# Patient Record
Sex: Female | Born: 1980 | Race: Black or African American | Hispanic: No | Marital: Single | Smoking: Former smoker
Health system: Southern US, Community
[De-identification: ages and names within clinical notes are randomized; demographics above are authoritative.]

## PROBLEM LIST (undated history)

## (undated) DIAGNOSIS — Z5189 Encounter for other specified aftercare: Secondary | ICD-10-CM

## (undated) DIAGNOSIS — O139 Gestational [pregnancy-induced] hypertension without significant proteinuria, unspecified trimester: Secondary | ICD-10-CM

## (undated) HISTORY — DX: Gestational (pregnancy-induced) hypertension without significant proteinuria, unspecified trimester: O13.9

## (undated) HISTORY — PX: INDUCED ABORTION: SHX677

---

## 2016-03-09 DIAGNOSIS — O1490 Unspecified pre-eclampsia, unspecified trimester: Secondary | ICD-10-CM

## 2020-07-29 DIAGNOSIS — Z20822 Contact with and (suspected) exposure to covid-19: Secondary | ICD-10-CM | POA: Diagnosis not present

## 2020-09-04 DIAGNOSIS — M549 Dorsalgia, unspecified: Secondary | ICD-10-CM | POA: Diagnosis not present

## 2020-09-04 DIAGNOSIS — N390 Urinary tract infection, site not specified: Secondary | ICD-10-CM | POA: Diagnosis not present

## 2020-09-17 DIAGNOSIS — R102 Pelvic and perineal pain: Secondary | ICD-10-CM | POA: Diagnosis not present

## 2020-09-17 DIAGNOSIS — Z01419 Encounter for gynecological examination (general) (routine) without abnormal findings: Secondary | ICD-10-CM | POA: Diagnosis not present

## 2020-09-19 ENCOUNTER — Other Ambulatory Visit: Payer: Self-pay | Admitting: Obstetrics and Gynecology

## 2020-09-19 DIAGNOSIS — Z1231 Encounter for screening mammogram for malignant neoplasm of breast: Secondary | ICD-10-CM

## 2020-09-25 ENCOUNTER — Ambulatory Visit: Payer: Self-pay

## 2020-09-27 ENCOUNTER — Ambulatory Visit: Payer: Self-pay

## 2020-10-09 DIAGNOSIS — R102 Pelvic and perineal pain: Secondary | ICD-10-CM | POA: Diagnosis not present

## 2020-10-16 ENCOUNTER — Ambulatory Visit
Admission: RE | Admit: 2020-10-16 | Discharge: 2020-10-16 | Disposition: A | Discharge status: Home / Self Care | Payer: Self-pay | Source: Ambulatory Visit | Attending: Obstetrics and Gynecology | Admitting: Obstetrics and Gynecology

## 2020-10-16 ENCOUNTER — Other Ambulatory Visit: Payer: Self-pay

## 2020-10-16 DIAGNOSIS — Z1231 Encounter for screening mammogram for malignant neoplasm of breast: Secondary | ICD-10-CM

## 2021-02-10 LAB — OB RESULTS CONSOLE RUBELLA ANTIBODY, IGM: Rubella: IMMUNE

## 2021-02-10 LAB — OB RESULTS CONSOLE HIV ANTIBODY (ROUTINE TESTING): HIV: NONREACTIVE

## 2021-02-10 LAB — OB RESULTS CONSOLE ABO/RH: RH Type: POSITIVE

## 2021-02-10 LAB — HEPATITIS C ANTIBODY: HCV Ab: NEGATIVE

## 2021-02-10 LAB — OB RESULTS CONSOLE HEPATITIS B SURFACE ANTIGEN: Hepatitis B Surface Ag: NEGATIVE

## 2021-03-11 LAB — OB RESULTS CONSOLE GC/CHLAMYDIA
Chlamydia: NEGATIVE
Neisseria Gonorrhea: NEGATIVE

## 2021-03-17 ENCOUNTER — Other Ambulatory Visit: Payer: Self-pay | Admitting: Obstetrics and Gynecology

## 2021-03-18 ENCOUNTER — Other Ambulatory Visit: Payer: Self-pay | Admitting: Obstetrics and Gynecology

## 2021-03-18 DIAGNOSIS — Z363 Encounter for antenatal screening for malformations: Secondary | ICD-10-CM

## 2021-04-06 ENCOUNTER — Other Ambulatory Visit: Payer: Self-pay

## 2021-04-30 ENCOUNTER — Encounter: Payer: Self-pay | Admitting: *Deleted

## 2021-05-06 ENCOUNTER — Ambulatory Visit: Payer: 59 | Admitting: *Deleted

## 2021-05-06 ENCOUNTER — Ambulatory Visit (HOSPITAL_BASED_OUTPATIENT_CLINIC_OR_DEPARTMENT_OTHER): Payer: 59 | Admitting: Maternal & Fetal Medicine

## 2021-05-06 ENCOUNTER — Other Ambulatory Visit: Payer: Self-pay | Admitting: *Deleted

## 2021-05-06 ENCOUNTER — Encounter: Payer: Self-pay | Admitting: *Deleted

## 2021-05-06 ENCOUNTER — Other Ambulatory Visit: Payer: Self-pay

## 2021-05-06 ENCOUNTER — Ambulatory Visit: Payer: 59 | Attending: Obstetrics and Gynecology

## 2021-05-06 VITALS — BP 97/53 | HR 70 | Ht 62.0 in

## 2021-05-06 DIAGNOSIS — Z8759 Personal history of other complications of pregnancy, childbirth and the puerperium: Secondary | ICD-10-CM

## 2021-05-06 DIAGNOSIS — O09522 Supervision of elderly multigravida, second trimester: Secondary | ICD-10-CM

## 2021-05-06 DIAGNOSIS — O09292 Supervision of pregnancy with other poor reproductive or obstetric history, second trimester: Secondary | ICD-10-CM

## 2021-05-06 DIAGNOSIS — Z3A18 18 weeks gestation of pregnancy: Secondary | ICD-10-CM | POA: Diagnosis not present

## 2021-05-06 DIAGNOSIS — Z3689 Encounter for other specified antenatal screening: Secondary | ICD-10-CM | POA: Diagnosis not present

## 2021-05-06 DIAGNOSIS — O09512 Supervision of elderly primigravida, second trimester: Secondary | ICD-10-CM

## 2021-05-06 DIAGNOSIS — O34219 Maternal care for unspecified type scar from previous cesarean delivery: Secondary | ICD-10-CM

## 2021-05-06 DIAGNOSIS — Z363 Encounter for antenatal screening for malformations: Secondary | ICD-10-CM | POA: Insufficient documentation

## 2021-05-06 NOTE — Progress Notes (Signed)
MFM Brief Note  Rachel Villa is a 41 yo G3P0 who is seen at 72 w 0 d with an EDD of 10/07/21 at the request of Dr. Christophe Louis. She is dated by LMP of 12/31/20.  She is seen todayfor the following concerns.  1) AMA 41 yo   2) Prior history of eclampsia with an IUFD at 27 weeks.   Rachel Villa reported that she was home and wasn't feeling well. Her family told her that she had a seizure. She then went to the ER and noted that her blood pressure was elevated. There was a documented FHR per Rachel Villa. She was transferred to another facility with an obstetrical service. When they began a fetal assessment there was no cardiac activity. She then opted for a cesarean delivery vs IOL. She reported that her labs were normal and that her blood pressures resolved. She did not have any further issues regarding her blood pressure.   3) Prior cesarean delivery: Rachel Villa had an emergent cesarean and does not recall that anyone telling her that she could not labor in the future. So I suspect that she had a low transverse cesarean delivery.  4) Family history of Factor V Leiden. Rachel Villa reports that she was screened and she screened negative. She had never had a thrombotic event.    Today she is overall doing well. She reports a normal low risk NIPS. She denies s/sx of preterm labor or preeclampsia.   Vitals with BMI 05/06/2021  Height 5\' 2"   Systolic 97  Diastolic 53  Pulse 70   Past Medical History:  Diagnosis Date   Pregnancy induced hypertension    Past Surgical History:  Procedure Laterality Date   CESAREAN SECTION     INDUCED ABORTION     Family History  Problem Relation Age of Onset   Hypertension Mother    Lung cancer Father    Muscular dystrophy Sister    OB History  Gravida Para Term Preterm AB Living  3 1 0 1 1 0  SAB IAB Ectopic Multiple Live Births  0 1 0 0 0    # Outcome Date GA Lbr Len/2nd Weight Sex Delivery Anes PTL Lv  3 Current           2 IAB           1 Preterm   [redacted]w[redacted]d    CS-Unspec   FD     Complications: Preeclampsia   Social History   Socioeconomic History   Marital status: Single    Spouse name: Not on file   Number of children: Not on file   Years of education: Not on file   Highest education level: Not on file  Occupational History   Not on file  Tobacco Use   Smoking status: Never   Smokeless tobacco: Never  Vaping Use   Vaping Use: Never used  Substance and Sexual Activity   Alcohol use: Not Currently   Drug use: Not Currently    Types: Marijuana   Sexual activity: Not on file  Other Topics Concern   Not on file  Social History Narrative   Not on file   Social Determinants of Health   Financial Resource Strain: Not on file  Food Insecurity: Not on file  Transportation Needs: Not on file  Physical Activity: Not on file  Stress: Not on file  Social Connections: Not on file  Intimate Partner Violence: Not on file       Current  Outpatient Medications (Analgesics):    aspirin EC 81 MG tablet, Take 81 mg by mouth daily. Swallow whole.   Current Outpatient Medications (Other):    Prenatal Vit-Fe Fumarate-FA (MULTIVITAMIN-PRENATAL) 27-0.8 MG TABS tablet, Take 1 tablet by mouth daily at 12 noon. Allergies  Allergen Reactions   Amoxicillin    Penicillins    Imaging:  Single intrauterine pregnancy with measurements consistent with dates. No markers of aneuploidy observed. Good fetal movement and amniotic fluid  Please report for details.  Impression:  I discussed with Rachel Villa that given her age and prior history of eclampsia she has a 50% risk of recurrent preeclampsia. We discussed that prevention should include 2x daily low dose ASA. Her blood pressure is currently normal. In addition, we discussed that baseline labs of CMP, CBC and UPC should be performed.   Secondly, we discussed her age related risk included an increase association with gestational diabetes, preeclampsia, fetal growth restriction and  stillbirth.   Given this we recommend serial growth exams with weekly antenatal testing commencing at 34-36 weeks.  With regards to timing of delivery 37-39 weeks is reasonable unless we observe FGR, hypertension or CNS symptoms. IF any are present we recommend individualized care with possible delivery between 34-37 weeks.  Risk of TOLAC vs repeat CS were discussed she is undecided but is aware of the ~1% risk for uterine rupture.  Given that she hasn't had a thrombotic event I think there is a low likelihood of Factor V Leiden and thus her daily low dose ASA should be effective therapy.  All questions answered.  I spent 60 minutes with > 50% in face to face consultation.  Vikki Ports, MD

## 2021-05-08 ENCOUNTER — Other Ambulatory Visit: Payer: Self-pay

## 2021-06-16 ENCOUNTER — Other Ambulatory Visit: Payer: Self-pay | Admitting: *Deleted

## 2021-06-16 ENCOUNTER — Encounter: Payer: Self-pay | Admitting: *Deleted

## 2021-06-16 ENCOUNTER — Ambulatory Visit: Payer: 59 | Admitting: *Deleted

## 2021-06-16 ENCOUNTER — Ambulatory Visit: Payer: 59 | Attending: Maternal & Fetal Medicine

## 2021-06-16 VITALS — BP 108/67 | HR 64

## 2021-06-16 DIAGNOSIS — O09522 Supervision of elderly multigravida, second trimester: Secondary | ICD-10-CM

## 2021-06-16 DIAGNOSIS — O34219 Maternal care for unspecified type scar from previous cesarean delivery: Secondary | ICD-10-CM

## 2021-06-16 DIAGNOSIS — Z3A23 23 weeks gestation of pregnancy: Secondary | ICD-10-CM | POA: Diagnosis not present

## 2021-06-16 DIAGNOSIS — Z8759 Personal history of other complications of pregnancy, childbirth and the puerperium: Secondary | ICD-10-CM

## 2021-06-16 DIAGNOSIS — O09292 Supervision of pregnancy with other poor reproductive or obstetric history, second trimester: Secondary | ICD-10-CM | POA: Insufficient documentation

## 2021-07-11 LAB — OB RESULTS CONSOLE RPR: RPR: NONREACTIVE

## 2021-07-17 ENCOUNTER — Other Ambulatory Visit: Payer: Self-pay | Admitting: *Deleted

## 2021-07-17 ENCOUNTER — Ambulatory Visit: Payer: 59 | Admitting: *Deleted

## 2021-07-17 ENCOUNTER — Ambulatory Visit: Payer: 59 | Attending: Obstetrics

## 2021-07-17 VITALS — BP 108/66 | HR 66

## 2021-07-17 DIAGNOSIS — O09293 Supervision of pregnancy with other poor reproductive or obstetric history, third trimester: Secondary | ICD-10-CM

## 2021-07-17 DIAGNOSIS — O09523 Supervision of elderly multigravida, third trimester: Secondary | ICD-10-CM | POA: Diagnosis not present

## 2021-07-17 DIAGNOSIS — O09522 Supervision of elderly multigravida, second trimester: Secondary | ICD-10-CM | POA: Diagnosis present

## 2021-07-17 DIAGNOSIS — Z820 Family history of epilepsy and other diseases of the nervous system: Secondary | ICD-10-CM

## 2021-07-17 DIAGNOSIS — Z8759 Personal history of other complications of pregnancy, childbirth and the puerperium: Secondary | ICD-10-CM

## 2021-07-17 DIAGNOSIS — O34219 Maternal care for unspecified type scar from previous cesarean delivery: Secondary | ICD-10-CM | POA: Diagnosis not present

## 2021-07-17 DIAGNOSIS — Z3A28 28 weeks gestation of pregnancy: Secondary | ICD-10-CM

## 2021-07-17 DIAGNOSIS — Z8489 Family history of other specified conditions: Secondary | ICD-10-CM

## 2021-07-29 ENCOUNTER — Telehealth: Payer: Self-pay

## 2021-07-29 NOTE — Telephone Encounter (Signed)
Left message for patient to call [place] office for 5/25 & 6/1 ultrasound appointment times

## 2021-07-31 ENCOUNTER — Ambulatory Visit: Payer: 59 | Admitting: *Deleted

## 2021-07-31 ENCOUNTER — Ambulatory Visit: Payer: 59 | Attending: Obstetrics

## 2021-07-31 VITALS — BP 108/70 | HR 74

## 2021-07-31 DIAGNOSIS — Z820 Family history of epilepsy and other diseases of the nervous system: Secondary | ICD-10-CM | POA: Insufficient documentation

## 2021-07-31 DIAGNOSIS — Z8489 Family history of other specified conditions: Secondary | ICD-10-CM

## 2021-07-31 DIAGNOSIS — O09293 Supervision of pregnancy with other poor reproductive or obstetric history, third trimester: Secondary | ICD-10-CM

## 2021-07-31 DIAGNOSIS — O34219 Maternal care for unspecified type scar from previous cesarean delivery: Secondary | ICD-10-CM

## 2021-07-31 DIAGNOSIS — O09523 Supervision of elderly multigravida, third trimester: Secondary | ICD-10-CM | POA: Diagnosis not present

## 2021-07-31 DIAGNOSIS — Z3A30 30 weeks gestation of pregnancy: Secondary | ICD-10-CM

## 2021-07-31 DIAGNOSIS — Z8759 Personal history of other complications of pregnancy, childbirth and the puerperium: Secondary | ICD-10-CM | POA: Diagnosis present

## 2021-08-07 ENCOUNTER — Encounter: Payer: Self-pay | Admitting: *Deleted

## 2021-08-07 ENCOUNTER — Ambulatory Visit: Payer: 59 | Attending: Obstetrics

## 2021-08-07 ENCOUNTER — Ambulatory Visit: Payer: 59 | Admitting: *Deleted

## 2021-08-07 ENCOUNTER — Other Ambulatory Visit: Payer: Self-pay | Admitting: *Deleted

## 2021-08-07 VITALS — BP 116/83 | HR 81

## 2021-08-07 DIAGNOSIS — O09523 Supervision of elderly multigravida, third trimester: Secondary | ICD-10-CM

## 2021-08-07 DIAGNOSIS — Z8489 Family history of other specified conditions: Secondary | ICD-10-CM

## 2021-08-07 DIAGNOSIS — Z8759 Personal history of other complications of pregnancy, childbirth and the puerperium: Secondary | ICD-10-CM | POA: Insufficient documentation

## 2021-08-07 DIAGNOSIS — O34219 Maternal care for unspecified type scar from previous cesarean delivery: Secondary | ICD-10-CM

## 2021-08-07 DIAGNOSIS — Z3A31 31 weeks gestation of pregnancy: Secondary | ICD-10-CM | POA: Diagnosis not present

## 2021-08-07 DIAGNOSIS — Z820 Family history of epilepsy and other diseases of the nervous system: Secondary | ICD-10-CM | POA: Diagnosis present

## 2021-08-07 DIAGNOSIS — O365930 Maternal care for other known or suspected poor fetal growth, third trimester, not applicable or unspecified: Secondary | ICD-10-CM

## 2021-08-07 DIAGNOSIS — O09293 Supervision of pregnancy with other poor reproductive or obstetric history, third trimester: Secondary | ICD-10-CM | POA: Diagnosis not present

## 2021-08-11 ENCOUNTER — Ambulatory Visit: Payer: 59

## 2021-08-14 ENCOUNTER — Ambulatory Visit: Payer: 59 | Admitting: *Deleted

## 2021-08-14 ENCOUNTER — Other Ambulatory Visit: Payer: Self-pay | Admitting: Obstetrics

## 2021-08-14 ENCOUNTER — Ambulatory Visit: Payer: 59 | Attending: Obstetrics

## 2021-08-14 VITALS — BP 119/84 | HR 71

## 2021-08-14 DIAGNOSIS — Z8759 Personal history of other complications of pregnancy, childbirth and the puerperium: Secondary | ICD-10-CM | POA: Diagnosis not present

## 2021-08-14 DIAGNOSIS — Z820 Family history of epilepsy and other diseases of the nervous system: Secondary | ICD-10-CM

## 2021-08-14 DIAGNOSIS — Z8489 Family history of other specified conditions: Secondary | ICD-10-CM | POA: Diagnosis not present

## 2021-08-14 DIAGNOSIS — O34219 Maternal care for unspecified type scar from previous cesarean delivery: Secondary | ICD-10-CM

## 2021-08-14 DIAGNOSIS — O09523 Supervision of elderly multigravida, third trimester: Secondary | ICD-10-CM

## 2021-08-14 DIAGNOSIS — Z3A32 32 weeks gestation of pregnancy: Secondary | ICD-10-CM

## 2021-08-14 DIAGNOSIS — O365930 Maternal care for other known or suspected poor fetal growth, third trimester, not applicable or unspecified: Secondary | ICD-10-CM

## 2021-08-14 DIAGNOSIS — O09293 Supervision of pregnancy with other poor reproductive or obstetric history, third trimester: Secondary | ICD-10-CM

## 2021-08-18 ENCOUNTER — Other Ambulatory Visit: Payer: 59

## 2021-08-18 ENCOUNTER — Ambulatory Visit: Payer: 59

## 2021-08-21 ENCOUNTER — Ambulatory Visit: Payer: 59 | Attending: Obstetrics

## 2021-08-21 ENCOUNTER — Other Ambulatory Visit: Payer: Self-pay | Admitting: Obstetrics

## 2021-08-21 ENCOUNTER — Encounter: Payer: Self-pay | Admitting: *Deleted

## 2021-08-21 ENCOUNTER — Ambulatory Visit: Payer: 59 | Admitting: *Deleted

## 2021-08-21 VITALS — BP 129/88 | HR 62

## 2021-08-21 DIAGNOSIS — Z8759 Personal history of other complications of pregnancy, childbirth and the puerperium: Secondary | ICD-10-CM | POA: Insufficient documentation

## 2021-08-21 DIAGNOSIS — Z3A33 33 weeks gestation of pregnancy: Secondary | ICD-10-CM

## 2021-08-21 DIAGNOSIS — Z8489 Family history of other specified conditions: Secondary | ICD-10-CM

## 2021-08-21 DIAGNOSIS — O34219 Maternal care for unspecified type scar from previous cesarean delivery: Secondary | ICD-10-CM

## 2021-08-21 DIAGNOSIS — O09293 Supervision of pregnancy with other poor reproductive or obstetric history, third trimester: Secondary | ICD-10-CM | POA: Diagnosis not present

## 2021-08-21 DIAGNOSIS — Z820 Family history of epilepsy and other diseases of the nervous system: Secondary | ICD-10-CM | POA: Diagnosis present

## 2021-08-21 DIAGNOSIS — O365930 Maternal care for other known or suspected poor fetal growth, third trimester, not applicable or unspecified: Secondary | ICD-10-CM

## 2021-08-21 DIAGNOSIS — O09523 Supervision of elderly multigravida, third trimester: Secondary | ICD-10-CM | POA: Diagnosis not present

## 2021-08-26 ENCOUNTER — Encounter (HOSPITAL_COMMUNITY): Payer: Self-pay

## 2021-08-27 ENCOUNTER — Ambulatory Visit (HOSPITAL_BASED_OUTPATIENT_CLINIC_OR_DEPARTMENT_OTHER): Payer: 59

## 2021-08-27 ENCOUNTER — Encounter: Payer: Self-pay | Admitting: *Deleted

## 2021-08-27 ENCOUNTER — Ambulatory Visit: Payer: 59 | Admitting: *Deleted

## 2021-08-27 VITALS — BP 128/89 | HR 59

## 2021-08-27 DIAGNOSIS — O34219 Maternal care for unspecified type scar from previous cesarean delivery: Secondary | ICD-10-CM

## 2021-08-27 DIAGNOSIS — O09523 Supervision of elderly multigravida, third trimester: Secondary | ICD-10-CM

## 2021-08-27 DIAGNOSIS — O365930 Maternal care for other known or suspected poor fetal growth, third trimester, not applicable or unspecified: Secondary | ICD-10-CM

## 2021-08-27 DIAGNOSIS — Z3A34 34 weeks gestation of pregnancy: Secondary | ICD-10-CM

## 2021-08-27 DIAGNOSIS — O09293 Supervision of pregnancy with other poor reproductive or obstetric history, third trimester: Secondary | ICD-10-CM | POA: Diagnosis not present

## 2021-08-27 DIAGNOSIS — Z8489 Family history of other specified conditions: Secondary | ICD-10-CM

## 2021-08-29 ENCOUNTER — Other Ambulatory Visit: Payer: Self-pay | Admitting: Obstetrics and Gynecology

## 2021-08-29 ENCOUNTER — Encounter (HOSPITAL_COMMUNITY): Payer: Self-pay | Admitting: Obstetrics and Gynecology

## 2021-08-29 ENCOUNTER — Inpatient Hospital Stay (HOSPITAL_COMMUNITY)
Admission: AD | Admit: 2021-08-29 | Discharge: 2021-09-06 | DRG: 787 | Disposition: A | Discharge status: Home / Self Care | Payer: 59 | Attending: Obstetrics and Gynecology | Admitting: Obstetrics and Gynecology

## 2021-08-29 ENCOUNTER — Other Ambulatory Visit: Payer: Self-pay

## 2021-08-29 DIAGNOSIS — O34219 Maternal care for unspecified type scar from previous cesarean delivery: Secondary | ICD-10-CM | POA: Diagnosis present

## 2021-08-29 DIAGNOSIS — Z87891 Personal history of nicotine dependence: Secondary | ICD-10-CM | POA: Diagnosis not present

## 2021-08-29 DIAGNOSIS — O365930 Maternal care for other known or suspected poor fetal growth, third trimester, not applicable or unspecified: Secondary | ICD-10-CM | POA: Diagnosis present

## 2021-08-29 DIAGNOSIS — D6959 Other secondary thrombocytopenia: Secondary | ICD-10-CM | POA: Diagnosis present

## 2021-08-29 DIAGNOSIS — O1413 Severe pre-eclampsia, third trimester: Principal | ICD-10-CM

## 2021-08-29 DIAGNOSIS — O9912 Other diseases of the blood and blood-forming organs and certain disorders involving the immune mechanism complicating childbirth: Secondary | ICD-10-CM | POA: Diagnosis present

## 2021-08-29 DIAGNOSIS — O1414 Severe pre-eclampsia complicating childbirth: Secondary | ICD-10-CM | POA: Diagnosis present

## 2021-08-29 DIAGNOSIS — O09293 Supervision of pregnancy with other poor reproductive or obstetric history, third trimester: Secondary | ICD-10-CM | POA: Diagnosis not present

## 2021-08-29 DIAGNOSIS — O365931 Maternal care for other known or suspected poor fetal growth, third trimester, fetus 1: Secondary | ICD-10-CM | POA: Diagnosis present

## 2021-08-29 DIAGNOSIS — O34211 Maternal care for low transverse scar from previous cesarean delivery: Secondary | ICD-10-CM | POA: Diagnosis not present

## 2021-08-29 DIAGNOSIS — O165 Unspecified maternal hypertension, complicating the puerperium: Secondary | ICD-10-CM | POA: Diagnosis not present

## 2021-08-29 DIAGNOSIS — D62 Acute posthemorrhagic anemia: Secondary | ICD-10-CM | POA: Diagnosis not present

## 2021-08-29 DIAGNOSIS — Z3A35 35 weeks gestation of pregnancy: Secondary | ICD-10-CM | POA: Diagnosis not present

## 2021-08-29 DIAGNOSIS — D696 Thrombocytopenia, unspecified: Secondary | ICD-10-CM | POA: Diagnosis not present

## 2021-08-29 DIAGNOSIS — O99113 Other diseases of the blood and blood-forming organs and certain disorders involving the immune mechanism complicating pregnancy, third trimester: Secondary | ICD-10-CM | POA: Diagnosis not present

## 2021-08-29 DIAGNOSIS — Z3A34 34 weeks gestation of pregnancy: Secondary | ICD-10-CM | POA: Diagnosis not present

## 2021-08-29 DIAGNOSIS — O99119 Other diseases of the blood and blood-forming organs and certain disorders involving the immune mechanism complicating pregnancy, unspecified trimester: Secondary | ICD-10-CM

## 2021-08-29 DIAGNOSIS — O9081 Anemia of the puerperium: Secondary | ICD-10-CM | POA: Diagnosis not present

## 2021-08-29 DIAGNOSIS — O133 Gestational [pregnancy-induced] hypertension without significant proteinuria, third trimester: Secondary | ICD-10-CM | POA: Diagnosis not present

## 2021-08-29 LAB — TYPE AND SCREEN
ABO/RH(D): B POS
Antibody Screen: NEGATIVE

## 2021-08-29 LAB — CBC
HCT: 38.8 % (ref 36.0–46.0)
HCT: 36.5 % (ref 36.0–46.0)
Hemoglobin: 13.3 g/dL (ref 12.0–15.0)
Hemoglobin: 12.4 g/dL (ref 12.0–15.0)
MCH: 30.8 pg (ref 26.0–34.0)
MCH: 30.3 pg (ref 26.0–34.0)
MCHC: 34.3 g/dL (ref 30.0–36.0)
MCHC: 34 g/dL (ref 30.0–36.0)
MCV: 89.8 fL (ref 80.0–100.0)
MCV: 89.2 fL (ref 80.0–100.0)
Platelets: 104 10*3/uL — ABNORMAL LOW (ref 150–400)
Platelets: 102 10*3/uL — ABNORMAL LOW (ref 150–400)
RBC: 4.32 MIL/uL (ref 3.87–5.11)
RBC: 4.09 MIL/uL (ref 3.87–5.11)
RDW: 12.8 % (ref 11.5–15.5)
RDW: 12.9 % (ref 11.5–15.5)
WBC: 5.7 10*3/uL (ref 4.0–10.5)
WBC: 8.3 10*3/uL (ref 4.0–10.5)
nRBC: 0 % (ref 0.0–0.2)
nRBC: 0 % (ref 0.0–0.2)

## 2021-08-29 LAB — COMPREHENSIVE METABOLIC PANEL
ALT: 13 U/L (ref 0–44)
ALT: 13 U/L (ref 0–44)
AST: 22 U/L (ref 15–41)
AST: 21 U/L (ref 15–41)
Albumin: 2.7 g/dL — ABNORMAL LOW (ref 3.5–5.0)
Albumin: 2.7 g/dL — ABNORMAL LOW (ref 3.5–5.0)
Alkaline Phosphatase: 253 U/L — ABNORMAL HIGH (ref 38–126)
Alkaline Phosphatase: 248 U/L — ABNORMAL HIGH (ref 38–126)
Anion gap: 7 (ref 5–15)
Anion gap: 9 (ref 5–15)
BUN: 8 mg/dL (ref 6–20)
BUN: 7 mg/dL (ref 6–20)
CO2: 22 mmol/L (ref 22–32)
CO2: 19 mmol/L — ABNORMAL LOW (ref 22–32)
Calcium: 9.1 mg/dL (ref 8.9–10.3)
Calcium: 9.1 mg/dL (ref 8.9–10.3)
Chloride: 107 mmol/L (ref 98–111)
Chloride: 106 mmol/L (ref 98–111)
Creatinine, Ser: 1.02 mg/dL — ABNORMAL HIGH (ref 0.44–1.00)
Creatinine, Ser: 1.1 mg/dL — ABNORMAL HIGH (ref 0.44–1.00)
GFR, Estimated: >60 mL/min (ref >60)
GFR, Estimated: >60 mL/min (ref >60)
Glucose, Bld: 64 mg/dL — ABNORMAL LOW (ref 70–99)
Glucose, Bld: 210 mg/dL — ABNORMAL HIGH (ref 70–99)
Potassium: 4 mmol/L (ref 3.5–5.1)
Potassium: 3.9 mmol/L (ref 3.5–5.1)
Sodium: 136 mmol/L (ref 135–145)
Sodium: 134 mmol/L — ABNORMAL LOW (ref 135–145)
Total Bilirubin: 0.4 mg/dL (ref 0.3–1.2)
Total Bilirubin: 0.5 mg/dL (ref 0.3–1.2)
Total Protein: 5.9 g/dL — ABNORMAL LOW (ref 6.5–8.1)
Total Protein: 5.9 g/dL — ABNORMAL LOW (ref 6.5–8.1)

## 2021-08-29 LAB — PROTEIN / CREATININE RATIO, URINE
Creatinine, Urine: 31.97 mg/dL
Total Protein, Urine: <6 mg/dL

## 2021-08-29 LAB — URIC ACID: Uric Acid, Serum: 7.5 mg/dL — ABNORMAL HIGH (ref 2.5–7.1)

## 2021-08-29 LAB — LACTATE DEHYDROGENASE: LDH: 169 U/L (ref 98–192)

## 2021-08-29 MED ORDER — CALCIUM CARBONATE ANTACID 500 MG PO CHEW: 2.0000 | CHEWABLE_TABLET | ORAL | Status: DC | PRN

## 2021-08-29 MED ORDER — ZOLPIDEM TARTRATE 5 MG PO TABS: 5.0000 mg | ORAL_TABLET | Freq: Every evening | ORAL | Status: DC | PRN

## 2021-08-29 MED ORDER — MAGNESIUM SULFATE BOLUS VIA INFUSION
4.0000 g | Freq: Once | INTRAVENOUS | Status: DC
  Filled 2021-08-29: qty 1000

## 2021-08-29 MED ORDER — HYDRALAZINE HCL 20 MG/ML IJ SOLN: 10.0000 mg | INTRAMUSCULAR | Status: DC | PRN

## 2021-08-29 MED ORDER — ACETAMINOPHEN 325 MG PO TABS: 650.0000 mg | ORAL_TABLET | ORAL | Status: DC | PRN

## 2021-08-29 MED ORDER — MAGNESIUM SULFATE 40 GM/1000ML IV SOLN
2.0000 g/h | INTRAVENOUS | Status: DC
Start: 2021-08-29 — End: 2021-08-29
  Filled 2021-08-29: qty 1000

## 2021-08-29 MED ORDER — PRENATAL MULTIVITAMIN CH: 1.0000 | ORAL_TABLET | Freq: Every day | ORAL | Status: DC

## 2021-08-29 MED ORDER — DOCUSATE SODIUM 100 MG PO CAPS
100.0000 mg | ORAL_CAPSULE | Freq: Every day | ORAL | Status: DC
  Filled 2021-08-29 (×2): qty 1

## 2021-08-29 MED ORDER — LACTATED RINGERS IV SOLN: INTRAVENOUS | Status: DC

## 2021-08-29 MED ORDER — LABETALOL HCL 5 MG/ML IV SOLN: 20.0000 mg | INTRAVENOUS | Status: DC | PRN

## 2021-08-29 MED ORDER — LABETALOL HCL 5 MG/ML IV SOLN: 40.0000 mg | INTRAVENOUS | Status: DC | PRN

## 2021-08-29 MED ORDER — LABETALOL HCL 5 MG/ML IV SOLN: 80.0000 mg | INTRAVENOUS | Status: DC | PRN

## 2021-08-29 MED ORDER — BETAMETHASONE SOD PHOS & ACET 6 (3-3) MG/ML IJ SUSP
12.0000 mg | INTRAMUSCULAR | Status: AC
  Administered 2021-08-29 – 2021-08-30 (×2): 12 mg via INTRAMUSCULAR
  Filled 2021-08-29: qty 5

## 2021-08-29 NOTE — Patient Instructions (Signed)
Kaylonnie Sturges  08/29/2021   Your procedure is scheduled on:  09/16/2021  Arrive at 0945 at Entrance C on [employer] at [facility]  and [employer]. You are invited to use the FREE valet parking or use the Visitor's parking deck.  Pick up the phone at the desk and dial [phone].  Call this number if you have problems the morning of surgery: [phone]  Remember:   Do not eat food:(After Midnight) Desps de medianoche.  Do not drink clear liquids: (After Midnight) Desps de medianoche.  Take these medicines the morning of surgery with A SIP OF WATER:  none   Do not wear jewelry, make-up or nail polish.  Do not wear lotions, powders, or perfumes. Do not wear deodorant.  Do not shave 48 hours prior to surgery.  Do not bring valuables to the hospital.  [facility] is not   responsible for any belongings or valuables brought to the hospital.  Contacts, dentures or bridgework may not be worn into surgery.  Leave suitcase in the car. After surgery it may be brought to your room.  For patients admitted to the hospital, checkout time is 11:00 AM the day of              discharge.      Please read over the following fact sheets that you were given:     Preparing for Surgery

## 2021-08-30 LAB — CBC
HCT: 38.9 % (ref 36.0–46.0)
Hemoglobin: 13.7 g/dL (ref 12.0–15.0)
MCH: 31.4 pg (ref 26.0–34.0)
MCHC: 35.2 g/dL (ref 30.0–36.0)
MCV: 89 fL (ref 80.0–100.0)
Platelets: 124 10*3/uL — ABNORMAL LOW (ref 150–400)
RBC: 4.37 MIL/uL (ref 3.87–5.11)
RDW: 12.8 % (ref 11.5–15.5)
WBC: 16.9 10*3/uL — ABNORMAL HIGH (ref 4.0–10.5)
nRBC: 0 % (ref 0.0–0.2)

## 2021-08-30 LAB — COMPREHENSIVE METABOLIC PANEL
ALT: 14 U/L (ref 0–44)
AST: 27 U/L (ref 15–41)
Albumin: 2.9 g/dL — ABNORMAL LOW (ref 3.5–5.0)
Alkaline Phosphatase: 262 U/L — ABNORMAL HIGH (ref 38–126)
Anion gap: 12 (ref 5–15)
BUN: 9 mg/dL (ref 6–20)
CO2: 17 mmol/L — ABNORMAL LOW (ref 22–32)
Calcium: 9.2 mg/dL (ref 8.9–10.3)
Chloride: 105 mmol/L (ref 98–111)
Creatinine, Ser: 1.1 mg/dL — ABNORMAL HIGH (ref 0.44–1.00)
GFR, Estimated: >60 mL/min (ref >60)
Glucose, Bld: 124 mg/dL — ABNORMAL HIGH (ref 70–99)
Potassium: 3.9 mmol/L (ref 3.5–5.1)
Sodium: 134 mmol/L — ABNORMAL LOW (ref 135–145)
Total Bilirubin: 0.1 mg/dL — ABNORMAL LOW (ref 0.3–1.2)
Total Protein: 6.2 g/dL — ABNORMAL LOW (ref 6.5–8.1)

## 2021-08-30 MED ORDER — ASPIRIN 81 MG PO TBEC
81.0000 mg | DELAYED_RELEASE_TABLET | Freq: Two times a day (BID) | ORAL | Status: DC
  Administered 2021-08-30 – 2021-09-02 (×7): 81 mg via ORAL
  Filled 2021-08-30 (×8): qty 1

## 2021-08-30 MED ORDER — PRENATAL MULTIVITAMIN CH
1.0000 | ORAL_TABLET | Freq: Every day | ORAL | Status: DC
  Administered 2021-08-30 – 2021-09-01 (×3): 1 via ORAL
  Filled 2021-08-30 (×4): qty 1

## 2021-08-31 LAB — COMPREHENSIVE METABOLIC PANEL
ALT: 13 U/L (ref 0–44)
AST: 20 U/L (ref 15–41)
Albumin: 2.6 g/dL — ABNORMAL LOW (ref 3.5–5.0)
Alkaline Phosphatase: 224 U/L — ABNORMAL HIGH (ref 38–126)
Anion gap: 9 (ref 5–15)
BUN: 10 mg/dL (ref 6–20)
CO2: 19 mmol/L — ABNORMAL LOW (ref 22–32)
Calcium: 9 mg/dL (ref 8.9–10.3)
Chloride: 109 mmol/L (ref 98–111)
Creatinine, Ser: 0.98 mg/dL (ref 0.44–1.00)
GFR, Estimated: >60 mL/min (ref >60)
Glucose, Bld: 122 mg/dL — ABNORMAL HIGH (ref 70–99)
Potassium: 4.1 mmol/L (ref 3.5–5.1)
Sodium: 137 mmol/L (ref 135–145)
Total Bilirubin: 0.5 mg/dL (ref 0.3–1.2)
Total Protein: 5.5 g/dL — ABNORMAL LOW (ref 6.5–8.1)

## 2021-08-31 LAB — CBC
HCT: 36.1 % (ref 36.0–46.0)
Hemoglobin: 12.2 g/dL (ref 12.0–15.0)
MCH: 30.3 pg (ref 26.0–34.0)
MCHC: 33.8 g/dL (ref 30.0–36.0)
MCV: 89.6 fL (ref 80.0–100.0)
Platelets: 100 10*3/uL — ABNORMAL LOW (ref 150–400)
RBC: 4.03 MIL/uL (ref 3.87–5.11)
RDW: 13.1 % (ref 11.5–15.5)
WBC: 17.8 10*3/uL — ABNORMAL HIGH (ref 4.0–10.5)
nRBC: 0.1 % (ref 0.0–0.2)

## 2021-08-31 LAB — CULTURE, BETA STREP (GROUP B ONLY)

## 2021-08-31 NOTE — Progress Notes (Addendum)
Patient ID: Rachel Villa, female   DOB: Mar 15, 1980, 41 y.o.   MRN: [id number]  Hospital Day No: 3  Subjective: Denies HA, visual changes or abdominal pain.  Objective: BP (!) 131/91 (BP Location: Left Arm)   Pulse 67   Temp 98.3 F (36.8 C) (Oral)   Resp 17   Ht 5\' 2"  (1.575 m)   Wt 61 kg   LMP 12/31/2020 (Exact Date)   SpO2 98%   BMI 24.58 kg/m  I/O last 3 completed shifts: In: 3540 [P.O.:3540] Out: 3850 [Urine:3850] Total I/O In: 480 [P.O.:480] Out: 300 [Urine:300]  Physical Exam:  Gen: alert and no distress Chest/Lungs: cta bilaterally  Heart/Pulse: RRR  Abdomen: soft, gravid, nontender Uterine fundus: soft, nontender Skin & Color: warm and dry  Neurological: AOx3, DTRs 2+, no clonus EXT: negative Homan's b/l, edema neg  FHT:  FHR: 130 bpm, variability: moderate,  accelerations:  Present,  decelerations:  Absent UC:   none SVE:    Deferred  Labs: Lab Results  Component Value Date   WBC 17.8 (H) 08/31/2021   HGB 12.2 08/31/2021   HCT 36.1 08/31/2021   MCV 89.6 08/31/2021   PLT 100 (L) 08/31/2021  GBS +  Assessment and Plan: has IUGR (intrauterine growth restriction) affecting care of mother, third trimester, fetus 1 on their problem list. Currently stable Cont current plan of care Labs reviewed Dr. Grace Bushy to give recommendations on further mgmt tomorrow Cat 1 FHT   Purcell Nails 08/31/2021, 8:45 PM

## 2021-09-01 DIAGNOSIS — O133 Gestational [pregnancy-induced] hypertension without significant proteinuria, third trimester: Secondary | ICD-10-CM

## 2021-09-01 DIAGNOSIS — O99113 Other diseases of the blood and blood-forming organs and certain disorders involving the immune mechanism complicating pregnancy, third trimester: Secondary | ICD-10-CM

## 2021-09-01 DIAGNOSIS — Z3A34 34 weeks gestation of pregnancy: Secondary | ICD-10-CM

## 2021-09-01 DIAGNOSIS — D696 Thrombocytopenia, unspecified: Secondary | ICD-10-CM

## 2021-09-01 LAB — COMPREHENSIVE METABOLIC PANEL
ALT: 12 U/L (ref 0–44)
AST: 21 U/L (ref 15–41)
Albumin: 2.6 g/dL — ABNORMAL LOW (ref 3.5–5.0)
Alkaline Phosphatase: 220 U/L — ABNORMAL HIGH (ref 38–126)
Anion gap: 13 (ref 5–15)
BUN: 11 mg/dL (ref 6–20)
CO2: 21 mmol/L — ABNORMAL LOW (ref 22–32)
Calcium: 9.1 mg/dL (ref 8.9–10.3)
Chloride: 106 mmol/L (ref 98–111)
Creatinine, Ser: 1 mg/dL (ref 0.44–1.00)
GFR, Estimated: >60 mL/min (ref >60)
Glucose, Bld: 96 mg/dL (ref 70–99)
Potassium: 4.3 mmol/L (ref 3.5–5.1)
Sodium: 140 mmol/L (ref 135–145)
Total Bilirubin: 0.5 mg/dL (ref 0.3–1.2)
Total Protein: 5.6 g/dL — ABNORMAL LOW (ref 6.5–8.1)

## 2021-09-01 LAB — CBC
HCT: 37.8 % (ref 36.0–46.0)
Hemoglobin: 12.5 g/dL (ref 12.0–15.0)
MCH: 30.2 pg (ref 26.0–34.0)
MCHC: 33.1 g/dL (ref 30.0–36.0)
MCV: 91.3 fL (ref 80.0–100.0)
Platelets: 106 10*3/uL — ABNORMAL LOW (ref 150–400)
RBC: 4.14 MIL/uL (ref 3.87–5.11)
RDW: 13.3 % (ref 11.5–15.5)
WBC: 14.3 10*3/uL — ABNORMAL HIGH (ref 4.0–10.5)
nRBC: 0.6 % — ABNORMAL HIGH (ref 0.0–0.2)

## 2021-09-02 ENCOUNTER — Inpatient Hospital Stay (HOSPITAL_COMMUNITY): Payer: 59 | Admitting: Anesthesiology

## 2021-09-02 ENCOUNTER — Inpatient Hospital Stay (HOSPITAL_COMMUNITY): Payer: 59

## 2021-09-02 ENCOUNTER — Encounter (HOSPITAL_COMMUNITY): Payer: Self-pay | Admitting: Obstetrics and Gynecology

## 2021-09-02 ENCOUNTER — Encounter (HOSPITAL_COMMUNITY)
Admission: AD | Disposition: A | Discharge status: Home / Self Care | Payer: Self-pay | Source: Home / Self Care | Attending: Obstetrics and Gynecology

## 2021-09-02 ENCOUNTER — Other Ambulatory Visit: Payer: Self-pay

## 2021-09-02 DIAGNOSIS — Z3A35 35 weeks gestation of pregnancy: Secondary | ICD-10-CM

## 2021-09-02 DIAGNOSIS — O34219 Maternal care for unspecified type scar from previous cesarean delivery: Secondary | ICD-10-CM

## 2021-09-02 DIAGNOSIS — O1414 Severe pre-eclampsia complicating childbirth: Secondary | ICD-10-CM | POA: Diagnosis not present

## 2021-09-02 DIAGNOSIS — O365930 Maternal care for other known or suspected poor fetal growth, third trimester, not applicable or unspecified: Secondary | ICD-10-CM

## 2021-09-02 DIAGNOSIS — O99113 Other diseases of the blood and blood-forming organs and certain disorders involving the immune mechanism complicating pregnancy, third trimester: Secondary | ICD-10-CM

## 2021-09-02 DIAGNOSIS — O09293 Supervision of pregnancy with other poor reproductive or obstetric history, third trimester: Secondary | ICD-10-CM | POA: Diagnosis not present

## 2021-09-02 DIAGNOSIS — O1413 Severe pre-eclampsia, third trimester: Secondary | ICD-10-CM | POA: Diagnosis present

## 2021-09-02 DIAGNOSIS — O34211 Maternal care for low transverse scar from previous cesarean delivery: Secondary | ICD-10-CM | POA: Diagnosis not present

## 2021-09-02 DIAGNOSIS — O365931 Maternal care for other known or suspected poor fetal growth, third trimester, fetus 1: Secondary | ICD-10-CM | POA: Diagnosis not present

## 2021-09-02 DIAGNOSIS — O133 Gestational [pregnancy-induced] hypertension without significant proteinuria, third trimester: Secondary | ICD-10-CM | POA: Diagnosis not present

## 2021-09-02 DIAGNOSIS — Z3A34 34 weeks gestation of pregnancy: Secondary | ICD-10-CM

## 2021-09-02 DIAGNOSIS — O165 Unspecified maternal hypertension, complicating the puerperium: Secondary | ICD-10-CM

## 2021-09-02 DIAGNOSIS — D696 Thrombocytopenia, unspecified: Secondary | ICD-10-CM | POA: Diagnosis not present

## 2021-09-02 HISTORY — PX: DILATION AND CURETTAGE OF UTERUS: SHX78

## 2021-09-02 LAB — CBC
HCT: 36.5 % (ref 36.0–46.0)
HCT: 36.8 % (ref 36.0–46.0)
HCT: 32.7 % — ABNORMAL LOW (ref 36.0–46.0)
HCT: 27.3 % — ABNORMAL LOW (ref 36.0–46.0)
Hemoglobin: 12.7 g/dL (ref 12.0–15.0)
Hemoglobin: 13 g/dL (ref 12.0–15.0)
Hemoglobin: 11.2 g/dL — ABNORMAL LOW (ref 12.0–15.0)
Hemoglobin: 8.8 g/dL — ABNORMAL LOW (ref 12.0–15.0)
MCH: 31.2 pg (ref 26.0–34.0)
MCH: 31.6 pg (ref 26.0–34.0)
MCH: 31.6 pg (ref 26.0–34.0)
MCH: 30.8 pg (ref 26.0–34.0)
MCHC: 34.8 g/dL (ref 30.0–36.0)
MCHC: 35.3 g/dL (ref 30.0–36.0)
MCHC: 34.3 g/dL (ref 30.0–36.0)
MCHC: 32.2 g/dL (ref 30.0–36.0)
MCV: 89.7 fL (ref 80.0–100.0)
MCV: 89.5 fL (ref 80.0–100.0)
MCV: 92.4 fL (ref 80.0–100.0)
MCV: 95.5 fL (ref 80.0–100.0)
Platelets: 97 10*3/uL — ABNORMAL LOW (ref 150–400)
Platelets: 108 10*3/uL — ABNORMAL LOW (ref 150–400)
Platelets: DECREASED 10*3/uL (ref 150–400)
Platelets: 119 10*3/uL — ABNORMAL LOW (ref 150–400)
RBC: 4.07 MIL/uL (ref 3.87–5.11)
RBC: 4.11 MIL/uL (ref 3.87–5.11)
RBC: 3.54 MIL/uL — ABNORMAL LOW (ref 3.87–5.11)
RBC: 2.86 MIL/uL — ABNORMAL LOW (ref 3.87–5.11)
RDW: 13.2 % (ref 11.5–15.5)
RDW: 13.1 % (ref 11.5–15.5)
RDW: 13.2 % (ref 11.5–15.5)
RDW: 13.2 % (ref 11.5–15.5)
WBC: 10.9 10*3/uL — ABNORMAL HIGH (ref 4.0–10.5)
WBC: 10.4 10*3/uL (ref 4.0–10.5)
WBC: 31.8 10*3/uL — ABNORMAL HIGH (ref 4.0–10.5)
WBC: 38.3 10*3/uL — ABNORMAL HIGH (ref 4.0–10.5)
nRBC: 1.2 % — ABNORMAL HIGH (ref 0.0–0.2)
nRBC: 1.3 % — ABNORMAL HIGH (ref 0.0–0.2)
nRBC: 0.7 % — ABNORMAL HIGH (ref 0.0–0.2)
nRBC: 0.9 % — ABNORMAL HIGH (ref 0.0–0.2)

## 2021-09-02 LAB — COMPREHENSIVE METABOLIC PANEL
ALT: 14 U/L (ref 0–44)
ALT: 15 U/L (ref 0–44)
AST: 20 U/L (ref 15–41)
AST: 36 U/L (ref 15–41)
Albumin: 2.3 g/dL — ABNORMAL LOW (ref 3.5–5.0)
Albumin: 2.1 g/dL — ABNORMAL LOW (ref 3.5–5.0)
Alkaline Phosphatase: 236 U/L — ABNORMAL HIGH (ref 38–126)
Alkaline Phosphatase: 210 U/L — ABNORMAL HIGH (ref 38–126)
Anion gap: 10 (ref 5–15)
Anion gap: 10 (ref 5–15)
BUN: 15 mg/dL (ref 6–20)
BUN: 14 mg/dL (ref 6–20)
CO2: 22 mmol/L (ref 22–32)
CO2: 20 mmol/L — ABNORMAL LOW (ref 22–32)
Calcium: 8.8 mg/dL — ABNORMAL LOW (ref 8.9–10.3)
Calcium: 7.9 mg/dL — ABNORMAL LOW (ref 8.9–10.3)
Chloride: 105 mmol/L (ref 98–111)
Chloride: 104 mmol/L (ref 98–111)
Creatinine, Ser: 1.02 mg/dL — ABNORMAL HIGH (ref 0.44–1.00)
Creatinine, Ser: 1.25 mg/dL — ABNORMAL HIGH (ref 0.44–1.00)
GFR, Estimated: >60 mL/min (ref >60)
GFR, Estimated: 55 mL/min — ABNORMAL LOW (ref >60)
Glucose, Bld: 87 mg/dL (ref 70–99)
Glucose, Bld: 94 mg/dL (ref 70–99)
Potassium: 4 mmol/L (ref 3.5–5.1)
Potassium: 5.1 mmol/L (ref 3.5–5.1)
Sodium: 137 mmol/L (ref 135–145)
Sodium: 134 mmol/L — ABNORMAL LOW (ref 135–145)
Total Bilirubin: 0.5 mg/dL (ref 0.3–1.2)
Total Bilirubin: 0.8 mg/dL (ref 0.3–1.2)
Total Protein: 5.1 g/dL — ABNORMAL LOW (ref 6.5–8.1)
Total Protein: 4.5 g/dL — ABNORMAL LOW (ref 6.5–8.1)

## 2021-09-02 LAB — PROTIME-INR
INR: 1.3 — ABNORMAL HIGH (ref 0.8–1.2)
Prothrombin Time: 16.5 seconds — ABNORMAL HIGH (ref 11.4–15.2)

## 2021-09-02 LAB — APTT: aPTT: 32 seconds (ref 24–36)

## 2021-09-02 LAB — PREPARE RBC (CROSSMATCH)

## 2021-09-02 LAB — FIBRINOGEN: Fibrinogen: 131 mg/dL — ABNORMAL LOW (ref 210–475)

## 2021-09-02 SURGERY — DILATION AND CURETTAGE
Anesthesia: General

## 2021-09-02 SURGERY — Surgical Case
Anesthesia: Spinal

## 2021-09-02 MED ORDER — PROMETHAZINE HCL 25 MG/ML IJ SOLN: 6.2500 mg | INTRAMUSCULAR | Status: DC | PRN

## 2021-09-02 MED ORDER — ONDANSETRON HCL 4 MG/2ML IJ SOLN
INTRAMUSCULAR | Status: AC
  Filled 2021-09-02: qty 2

## 2021-09-02 MED ORDER — MENTHOL 3 MG MT LOZG: 1.0000 | LOZENGE | OROMUCOSAL | Status: DC | PRN

## 2021-09-02 MED ORDER — FENTANYL CITRATE (PF) 100 MCG/2ML IJ SOLN: 25.0000 ug | INTRAMUSCULAR | Status: DC | PRN

## 2021-09-02 MED ORDER — OXYCODONE HCL 5 MG PO TABS
5.0000 mg | ORAL_TABLET | ORAL | Status: DC | PRN
  Administered 2021-09-03 – 2021-09-06 (×13): 5 mg via ORAL
  Filled 2021-09-02 (×13): qty 1

## 2021-09-02 MED ORDER — DEXAMETHASONE SODIUM PHOSPHATE 10 MG/ML IJ SOLN
INTRAMUSCULAR | Status: DC | PRN
  Administered 2021-09-02: 4 mg via INTRAVENOUS

## 2021-09-02 MED ORDER — SENNOSIDES-DOCUSATE SODIUM 8.6-50 MG PO TABS: 2.0000 | ORAL_TABLET | Freq: Every day | ORAL | Status: DC

## 2021-09-02 MED ORDER — TRANEXAMIC ACID-NACL 1000-0.7 MG/100ML-% IV SOLN
INTRAVENOUS | Status: AC
  Filled 2021-09-02: qty 100

## 2021-09-02 MED ORDER — METHYLERGONOVINE MALEATE 0.2 MG/ML IJ SOLN
INTRAMUSCULAR | Status: AC
  Administered 2021-09-02: 0.2 mg
  Filled 2021-09-02: qty 1

## 2021-09-02 MED ORDER — PRENATAL MULTIVITAMIN CH
1.0000 | ORAL_TABLET | Freq: Every day | ORAL | Status: DC
  Administered 2021-09-03 – 2021-09-05 (×3): 1 via ORAL
  Filled 2021-09-02 (×3): qty 1

## 2021-09-02 MED ORDER — IBUPROFEN 600 MG PO TABS: 600.0000 mg | ORAL_TABLET | Freq: Four times a day (QID) | ORAL | Status: DC

## 2021-09-02 MED ORDER — PRENATAL MULTIVITAMIN CH: 1.0000 | ORAL_TABLET | Freq: Every day | ORAL | Status: DC

## 2021-09-02 MED ORDER — ZOLPIDEM TARTRATE 5 MG PO TABS: 5.0000 mg | ORAL_TABLET | Freq: Every evening | ORAL | Status: DC | PRN

## 2021-09-02 MED ORDER — SCOPOLAMINE 1 MG/3DAYS TD PT72: 1.0000 | MEDICATED_PATCH | Freq: Once | TRANSDERMAL | Status: DC

## 2021-09-02 MED ORDER — SUGAMMADEX SODIUM 500 MG/5ML IV SOLN
INTRAVENOUS | Status: AC
  Filled 2021-09-02: qty 5

## 2021-09-02 MED ORDER — MIDAZOLAM HCL 2 MG/2ML IJ SOLN
INTRAMUSCULAR | Status: AC
  Filled 2021-09-02: qty 2

## 2021-09-02 MED ORDER — SIMETHICONE 80 MG PO CHEW
80.0000 mg | CHEWABLE_TABLET | Freq: Three times a day (TID) | ORAL | Status: DC
  Administered 2021-09-03 – 2021-09-05 (×8): 80 mg via ORAL
  Filled 2021-09-02 (×8): qty 1

## 2021-09-02 MED ORDER — SENNOSIDES-DOCUSATE SODIUM 8.6-50 MG PO TABS
2.0000 | ORAL_TABLET | Freq: Every day | ORAL | Status: DC
  Administered 2021-09-03 – 2021-09-06 (×4): 2 via ORAL
  Filled 2021-09-02 (×4): qty 2

## 2021-09-02 MED ORDER — HYDROMORPHONE HCL 1 MG/ML IJ SOLN: 0.2000 mg | INTRAMUSCULAR | Status: DC | PRN

## 2021-09-02 MED ORDER — LABETALOL HCL 5 MG/ML IV SOLN: 40.0000 mg | INTRAVENOUS | Status: DC | PRN

## 2021-09-02 MED ORDER — SUCCINYLCHOLINE CHLORIDE 200 MG/10ML IV SOSY
PREFILLED_SYRINGE | INTRAVENOUS | Status: DC | PRN
  Administered 2021-09-02: 100 mg via INTRAVENOUS

## 2021-09-02 MED ORDER — DIPHENOXYLATE-ATROPINE 2.5-0.025 MG PO TABS
2.0000 | ORAL_TABLET | Freq: Once | ORAL | Status: DC
Start: 2021-09-02 — End: 2021-09-02

## 2021-09-02 MED ORDER — MAGNESIUM SULFATE 40 GM/1000ML IV SOLN: 2.0000 g/h | INTRAVENOUS | Status: DC

## 2021-09-02 MED ORDER — DIPHENHYDRAMINE HCL 50 MG/ML IJ SOLN: 12.5000 mg | Freq: Four times a day (QID) | INTRAMUSCULAR | Status: DC | PRN

## 2021-09-02 MED ORDER — DEXAMETHASONE SODIUM PHOSPHATE 10 MG/ML IJ SOLN
INTRAMUSCULAR | Status: DC | PRN
  Administered 2021-09-02: 10 mg via INTRAVENOUS

## 2021-09-02 MED ORDER — DIBUCAINE (PERIANAL) 1 % EX OINT: 1.0000 | TOPICAL_OINTMENT | CUTANEOUS | Status: DC | PRN

## 2021-09-02 MED ORDER — HYDRALAZINE HCL 20 MG/ML IJ SOLN: 5.0000 mg | INTRAMUSCULAR | Status: DC | PRN

## 2021-09-02 MED ORDER — LABETALOL HCL 5 MG/ML IV SOLN: 20.0000 mg | INTRAVENOUS | Status: DC | PRN

## 2021-09-02 MED ORDER — COCONUT OIL OIL: 1.0000 | TOPICAL_OIL | Status: DC | PRN

## 2021-09-02 MED ORDER — EPHEDRINE SULFATE (PRESSORS) 50 MG/ML IJ SOLN
INTRAMUSCULAR | Status: DC | PRN
  Administered 2021-09-02: 5 mg via INTRAVENOUS

## 2021-09-02 MED ORDER — LACTATED RINGERS IV SOLN: INTRAVENOUS | Status: DC | PRN

## 2021-09-02 MED ORDER — WITCH HAZEL-GLYCERIN EX PADS: 1.0000 | MEDICATED_PAD | CUTANEOUS | Status: DC | PRN

## 2021-09-02 MED ORDER — TRANEXAMIC ACID-NACL 1000-0.7 MG/100ML-% IV SOLN
INTRAVENOUS | Status: DC | PRN
  Administered 2021-09-02: 1000 mg via INTRAVENOUS

## 2021-09-02 MED ORDER — MORPHINE SULFATE (PF) 0.5 MG/ML IJ SOLN
INTRAMUSCULAR | Status: AC
  Filled 2021-09-02: qty 10

## 2021-09-02 MED ORDER — ACETAMINOPHEN 500 MG PO TABS: 1000.0000 mg | ORAL_TABLET | Freq: Four times a day (QID) | ORAL | Status: DC

## 2021-09-02 MED ORDER — ONDANSETRON HCL 4 MG/2ML IJ SOLN
4.0000 mg | Freq: Three times a day (TID) | INTRAMUSCULAR | Status: DC | PRN
  Administered 2021-09-02 (×2): 4 mg via INTRAVENOUS

## 2021-09-02 MED ORDER — FENTANYL CITRATE (PF) 100 MCG/2ML IJ SOLN
INTRAMUSCULAR | Status: AC
  Filled 2021-09-02: qty 2

## 2021-09-02 MED ORDER — COCONUT OIL OIL
1.0000 | TOPICAL_OIL | Status: DC | PRN
  Administered 2021-09-03: 1 via TOPICAL

## 2021-09-02 MED ORDER — SOD CITRATE-CITRIC ACID 500-334 MG/5ML PO SOLN
30.0000 mL | ORAL | Status: AC
  Administered 2021-09-02: 30 mL via ORAL
  Filled 2021-09-02: qty 30

## 2021-09-02 MED ORDER — EPHEDRINE 5 MG/ML INJ
INTRAVENOUS | Status: AC
  Filled 2021-09-02: qty 5

## 2021-09-02 MED ORDER — DIPHENHYDRAMINE HCL 50 MG/ML IJ SOLN
INTRAMUSCULAR | Status: AC
  Filled 2021-09-02: qty 1

## 2021-09-02 MED ORDER — PROPOFOL 10 MG/ML IV BOLUS
INTRAVENOUS | Status: DC | PRN
  Administered 2021-09-02: 120 mg via INTRAVENOUS

## 2021-09-02 MED ORDER — SODIUM CHLORIDE 0.9% IV SOLUTION: Freq: Once | INTRAVENOUS | Status: AC

## 2021-09-02 MED ORDER — MIDAZOLAM HCL 2 MG/2ML IJ SOLN
INTRAMUSCULAR | Status: DC | PRN
  Administered 2021-09-02: 1 mg via INTRAVENOUS

## 2021-09-02 MED ORDER — CARBOPROST TROMETHAMINE 250 MCG/ML IM SOLN
INTRAMUSCULAR | Status: DC | PRN
  Administered 2021-09-02: 250 ug via INTRAMUSCULAR

## 2021-09-02 MED ORDER — ACETAMINOPHEN 500 MG PO TABS
1000.0000 mg | ORAL_TABLET | Freq: Four times a day (QID) | ORAL | Status: DC
  Administered 2021-09-03 – 2021-09-06 (×13): 1000 mg via ORAL
  Filled 2021-09-02 (×14): qty 2

## 2021-09-02 MED ORDER — FERROUS SULFATE 325 (65 FE) MG PO TABS
325.0000 mg | ORAL_TABLET | Freq: Two times a day (BID) | ORAL | Status: DC
  Administered 2021-09-03 – 2021-09-06 (×7): 325 mg via ORAL
  Filled 2021-09-02 (×8): qty 1

## 2021-09-02 MED ORDER — OXYTOCIN-SODIUM CHLORIDE 30-0.9 UT/500ML-% IV SOLN
2.5000 [IU]/h | INTRAVENOUS | Status: DC
  Administered 2021-09-02: 2.5 [IU]/h via INTRAVENOUS

## 2021-09-02 MED ORDER — TRANEXAMIC ACID-NACL 1000-0.7 MG/100ML-% IV SOLN
1000.0000 mg | INTRAVENOUS | Status: AC
  Administered 2021-09-02: 1000 mg via INTRAVENOUS

## 2021-09-02 MED ORDER — MEPERIDINE HCL 25 MG/ML IJ SOLN
12.5000 mg | Freq: Once | INTRAMUSCULAR | Status: AC
  Administered 2021-09-02: 12.5 mg via INTRAVENOUS

## 2021-09-02 MED ORDER — ROCURONIUM BROMIDE 100 MG/10ML IV SOLN
INTRAVENOUS | Status: DC | PRN
  Administered 2021-09-02: 20 mg via INTRAVENOUS

## 2021-09-02 MED ORDER — EPHEDRINE 5 MG/ML INJ: 10.0000 mg | Freq: Once | INTRAVENOUS | Status: DC

## 2021-09-02 MED ORDER — FENTANYL CITRATE (PF) 100 MCG/2ML IJ SOLN
INTRAMUSCULAR | Status: DC | PRN
  Administered 2021-09-02: 15 ug via INTRATHECAL

## 2021-09-02 MED ORDER — ONDANSETRON HCL 4 MG/2ML IJ SOLN
INTRAMUSCULAR | Status: DC | PRN
  Administered 2021-09-02: 4 mg via INTRAVENOUS

## 2021-09-02 MED ORDER — DEXAMETHASONE SODIUM PHOSPHATE 10 MG/ML IJ SOLN
INTRAMUSCULAR | Status: AC
  Filled 2021-09-02: qty 1

## 2021-09-02 MED ORDER — NALOXONE HCL 4 MG/10ML IJ SOLN: 1.0000 ug/kg/h | INTRAVENOUS | Status: DC | PRN

## 2021-09-02 MED ORDER — OXYTOCIN-SODIUM CHLORIDE 30-0.9 UT/500ML-% IV SOLN: 2.5000 [IU]/h | INTRAVENOUS | Status: AC

## 2021-09-02 MED ORDER — GENTAMICIN SULFATE 40 MG/ML IJ SOLN
5.0000 mg/kg | INTRAVENOUS | Status: AC
  Administered 2021-09-02: 307.2 mg via INTRAVENOUS
  Filled 2021-09-02: qty 7.75

## 2021-09-02 MED ORDER — OXYCODONE HCL 5 MG/5ML PO SOLN: 5.0000 mg | Freq: Once | ORAL | Status: DC | PRN

## 2021-09-02 MED ORDER — MEPERIDINE HCL 25 MG/ML IJ SOLN
INTRAMUSCULAR | Status: AC
  Filled 2021-09-02: qty 1

## 2021-09-02 MED ORDER — SUCCINYLCHOLINE CHLORIDE 200 MG/10ML IV SOSY
PREFILLED_SYRINGE | INTRAVENOUS | Status: AC
  Filled 2021-09-02: qty 10

## 2021-09-02 MED ORDER — ROCURONIUM BROMIDE 10 MG/ML (PF) SYRINGE
PREFILLED_SYRINGE | INTRAVENOUS | Status: AC
  Filled 2021-09-02: qty 10

## 2021-09-02 MED ORDER — HYDRALAZINE HCL 20 MG/ML IJ SOLN: 10.0000 mg | INTRAMUSCULAR | Status: DC | PRN

## 2021-09-02 MED ORDER — LABETALOL HCL 5 MG/ML IV SOLN: 80.0000 mg | INTRAVENOUS | Status: DC | PRN

## 2021-09-02 MED ORDER — OXYCODONE HCL 5 MG PO TABS: 5.0000 mg | ORAL_TABLET | Freq: Once | ORAL | Status: DC | PRN

## 2021-09-02 MED ORDER — ACETAMINOPHEN 10 MG/ML IV SOLN
INTRAVENOUS | Status: DC | PRN
  Administered 2021-09-02: 1000 mg via INTRAVENOUS

## 2021-09-02 MED ORDER — PHENYLEPHRINE HCL-NACL 20-0.9 MG/250ML-% IV SOLN
INTRAVENOUS | Status: DC | PRN
  Administered 2021-09-02: 60 ug/min via INTRAVENOUS

## 2021-09-02 MED ORDER — ACETAMINOPHEN 10 MG/ML IV SOLN: 1000.0000 mg | Freq: Once | INTRAVENOUS | Status: DC | PRN

## 2021-09-02 MED ORDER — FENTANYL CITRATE (PF) 100 MCG/2ML IJ SOLN
INTRAMUSCULAR | Status: DC | PRN
  Administered 2021-09-02 (×2): 25 ug via INTRAVENOUS

## 2021-09-02 MED ORDER — MAGNESIUM SULFATE 40 GM/1000ML IV SOLN
INTRAVENOUS | Status: AC
  Filled 2021-09-02: qty 1000

## 2021-09-02 MED ORDER — PHENYLEPHRINE HCL (PRESSORS) 10 MG/ML IV SOLN
INTRAVENOUS | Status: DC | PRN
  Administered 2021-09-02 (×5): 80 ug via INTRAVENOUS
  Administered 2021-09-02: 160 ug via INTRAVENOUS
  Administered 2021-09-02: 80 ug via INTRAVENOUS

## 2021-09-02 MED ORDER — OXYTOCIN-SODIUM CHLORIDE 30-0.9 UT/500ML-% IV SOLN
INTRAVENOUS | Status: DC | PRN
  Administered 2021-09-02: 300 mL via INTRAVENOUS

## 2021-09-02 MED ORDER — MENTHOL 3 MG MT LOZG
1.0000 | LOZENGE | OROMUCOSAL | Status: DC | PRN
Start: 2021-09-02 — End: 2021-09-06

## 2021-09-02 MED ORDER — BUPIVACAINE IN DEXTROSE 0.75-8.25 % IT SOLN
INTRATHECAL | Status: DC | PRN
  Administered 2021-09-02: 1.6 mL via INTRATHECAL

## 2021-09-02 MED ORDER — DIPHENHYDRAMINE HCL 50 MG/ML IJ SOLN
INTRAMUSCULAR | Status: DC | PRN
  Administered 2021-09-02: 12.5 mg via INTRAVENOUS

## 2021-09-02 MED ORDER — FAMOTIDINE 20 MG PO TABS: 20.0000 mg | ORAL_TABLET | Freq: Once | ORAL | Status: DC

## 2021-09-02 MED ORDER — PROPOFOL 10 MG/ML IV BOLUS
INTRAVENOUS | Status: AC
  Filled 2021-09-02: qty 20

## 2021-09-02 MED ORDER — MORPHINE SULFATE (PF) 0.5 MG/ML IJ SOLN
INTRAMUSCULAR | Status: DC | PRN
  Administered 2021-09-02: 150 ug via INTRATHECAL

## 2021-09-02 MED ORDER — LACTATED RINGERS IV SOLN
INTRAVENOUS | Status: DC
  Administered 2021-09-02: 300 mL via INTRAVENOUS

## 2021-09-02 MED ORDER — NALOXONE HCL 0.4 MG/ML IJ SOLN: 0.4000 mg | INTRAMUSCULAR | Status: DC | PRN

## 2021-09-02 MED ORDER — SIMETHICONE 80 MG PO CHEW: 80.0000 mg | CHEWABLE_TABLET | Freq: Three times a day (TID) | ORAL | Status: DC

## 2021-09-02 MED ORDER — MEPERIDINE HCL 25 MG/ML IJ SOLN: 6.2500 mg | INTRAMUSCULAR | Status: DC | PRN

## 2021-09-02 MED ORDER — SODIUM CHLORIDE 0.9 % IR SOLN
Status: DC | PRN
  Administered 2021-09-02 (×2): 1

## 2021-09-02 MED ORDER — DIPHENHYDRAMINE HCL 25 MG PO CAPS: 25.0000 mg | ORAL_CAPSULE | Freq: Four times a day (QID) | ORAL | Status: DC | PRN

## 2021-09-02 MED ORDER — FERROUS SULFATE 325 (65 FE) MG PO TABS: 325.0000 mg | ORAL_TABLET | Freq: Two times a day (BID) | ORAL | Status: DC

## 2021-09-02 MED ORDER — OXYTOCIN-SODIUM CHLORIDE 30-0.9 UT/500ML-% IV SOLN
INTRAVENOUS | Status: AC
  Filled 2021-09-02: qty 500

## 2021-09-02 MED ORDER — SODIUM CHLORIDE 0.9% FLUSH: 3.0000 mL | INTRAVENOUS | Status: DC | PRN

## 2021-09-02 MED ORDER — PHENYLEPHRINE 80 MCG/ML (10ML) SYRINGE FOR IV PUSH (FOR BLOOD PRESSURE SUPPORT)
PREFILLED_SYRINGE | INTRAVENOUS | Status: AC
  Filled 2021-09-02: qty 10

## 2021-09-02 MED ORDER — KETOROLAC TROMETHAMINE 30 MG/ML IJ SOLN: 30.0000 mg | Freq: Once | INTRAMUSCULAR | Status: DC

## 2021-09-02 MED ORDER — OXYCODONE HCL 5 MG PO TABS: 5.0000 mg | ORAL_TABLET | ORAL | Status: DC | PRN

## 2021-09-02 MED ORDER — IBUPROFEN 600 MG PO TABS
600.0000 mg | ORAL_TABLET | Freq: Four times a day (QID) | ORAL | Status: DC
  Administered 2021-09-03 (×2): 600 mg via ORAL
  Filled 2021-09-02 (×2): qty 1

## 2021-09-02 MED ORDER — PHENYLEPHRINE HCL-NACL 20-0.9 MG/250ML-% IV SOLN
INTRAVENOUS | Status: AC
  Filled 2021-09-02: qty 250

## 2021-09-02 MED ORDER — CARBOPROST TROMETHAMINE 250 MCG/ML IM SOLN
INTRAMUSCULAR | Status: AC
  Filled 2021-09-02: qty 1

## 2021-09-02 MED ORDER — SIMETHICONE 80 MG PO CHEW: 80.0000 mg | CHEWABLE_TABLET | ORAL | Status: DC | PRN

## 2021-09-02 MED ORDER — MAGNESIUM SULFATE BOLUS VIA INFUSION
4.0000 g | Freq: Once | INTRAVENOUS | Status: AC
  Administered 2021-09-02: 4 g via INTRAVENOUS
  Filled 2021-09-02: qty 1000

## 2021-09-02 MED ORDER — SUGAMMADEX SODIUM 200 MG/2ML IV SOLN
INTRAVENOUS | Status: DC | PRN
  Administered 2021-09-02: 120 mg via INTRAVENOUS

## 2021-09-02 MED ORDER — VANCOMYCIN HCL IN DEXTROSE 1-5 GM/200ML-% IV SOLN
1000.0000 mg | Freq: Once | INTRAVENOUS | Status: AC
Start: 2021-09-02 — End: 2021-09-02
  Administered 2021-09-02: 1000 mg via INTRAVENOUS
  Filled 2021-09-02 (×2): qty 200

## 2021-09-02 MED ORDER — DIPHENHYDRAMINE HCL 25 MG PO CAPS
25.0000 mg | ORAL_CAPSULE | Freq: Four times a day (QID) | ORAL | Status: DC | PRN
  Administered 2021-09-03: 25 mg via ORAL
  Filled 2021-09-02: qty 1

## 2021-09-02 SURGICAL SUPPLY — 35 items
BARRIER ADHS 3X4 INTERCEED (GAUZE/BANDAGES/DRESSINGS) ×1 IMPLANT
BENZOIN TINCTURE PRP APPL 2/3 (GAUZE/BANDAGES/DRESSINGS) ×2 IMPLANT
BINDER ABDOMINAL 10 UNV 27-48 (MISCELLANEOUS) IMPLANT
BINDER ABDOMINAL 12 ML 46-62 (SOFTGOODS) IMPLANT
CHLORAPREP W/TINT 26ML (MISCELLANEOUS) ×4 IMPLANT
CLAMP CORD UMBIL (MISCELLANEOUS) ×2 IMPLANT
CLOTH BEACON ORANGE TIMEOUT ST (SAFETY) ×2 IMPLANT
DRSG OPSITE POSTOP 4X10 (GAUZE/BANDAGES/DRESSINGS) ×2 IMPLANT
ELECT REM PT RETURN 9FT ADLT (ELECTROSURGICAL) ×2
ELECTRODE REM PT RTRN 9FT ADLT (ELECTROSURGICAL) ×1 IMPLANT
EXTRACTOR VACUUM KIWI (MISCELLANEOUS) IMPLANT
GLOVE BIOGEL M 7.0 STRL (GLOVE) ×4 IMPLANT
GLOVE BIOGEL PI IND STRL 7.0 (GLOVE) ×3 IMPLANT
GLOVE BIOGEL PI INDICATOR 7.0 (GLOVE) ×3
GOWN STRL REUS W/TWL LRG LVL3 (GOWN DISPOSABLE) ×6 IMPLANT
KIT ABG SYR 3ML LUER SLIP (SYRINGE) IMPLANT
NDL HYPO 25X5/8 SAFETYGLIDE (NEEDLE) IMPLANT
NEEDLE HYPO 25X5/8 SAFETYGLIDE (NEEDLE) IMPLANT
NS IRRIG 1000ML POUR BTL (IV SOLUTION) ×2 IMPLANT
PACK C SECTION WH (CUSTOM PROCEDURE TRAY) ×2 IMPLANT
PAD OB MATERNITY 4.3X12.25 (PERSONAL CARE ITEMS) ×2 IMPLANT
RTRCTR C-SECT PINK 25CM LRG (MISCELLANEOUS) IMPLANT
STRIP CLOSURE SKIN 1/2X4 (GAUZE/BANDAGES/DRESSINGS) ×2 IMPLANT
SUT MNCRL 0 VIOLET CTX 36 (SUTURE) ×2 IMPLANT
SUT MONOCRYL 0 CTX 36 (SUTURE) ×2
SUT PDS AB 0 CT1 27 (SUTURE) ×4 IMPLANT
SUT PLAIN 0 NONE (SUTURE) IMPLANT
SUT VIC AB 2-0 CT1 27 (SUTURE) ×1
SUT VIC AB 2-0 CT1 TAPERPNT 27 (SUTURE) ×1 IMPLANT
SUT VIC AB 3-0 SH 27 (SUTURE)
SUT VIC AB 3-0 SH 27X BRD (SUTURE) IMPLANT
SUT VIC AB 4-0 KS 27 (SUTURE) ×2 IMPLANT
TOWEL OR 17X24 6PK STRL BLUE (TOWEL DISPOSABLE) ×2 IMPLANT
TRAY FOLEY W/BAG SLVR 14FR LF (SET/KITS/TRAYS/PACK) ×2 IMPLANT
WATER STERILE IRR 1000ML POUR (IV SOLUTION) ×2 IMPLANT

## 2021-09-02 SURGICAL SUPPLY — 18 items
BENZOIN TINCTURE PRP APPL 2/3 (GAUZE/BANDAGES/DRESSINGS) ×1 IMPLANT
CATH ROBINSON RED A/P 16FR (CATHETERS) ×2 IMPLANT
CONT PATH 16OZ SNAP LID 3702 (MISCELLANEOUS) ×2 IMPLANT
DRSG OPSITE POSTOP 4X10 (GAUZE/BANDAGES/DRESSINGS) ×1 IMPLANT
GLOVE BIO SURGEON STRL SZ 6.5 (GLOVE) ×2 IMPLANT
GLOVE BIOGEL PI IND STRL 6.5 (GLOVE) ×1 IMPLANT
GLOVE BIOGEL PI IND STRL 7.0 (GLOVE) ×1 IMPLANT
GLOVE BIOGEL PI INDICATOR 6.5 (GLOVE) ×1
GLOVE BIOGEL PI INDICATOR 7.0 (GLOVE) ×1
GOWN STRL REUS W/ TWL LRG LVL3 (GOWN DISPOSABLE) ×3 IMPLANT
GOWN STRL REUS W/TWL LRG LVL3 (GOWN DISPOSABLE) ×3
HIBICLENS CHG 4% 4OZ BTL (MISCELLANEOUS) ×2 IMPLANT
PACK VAGINAL MINOR WOMEN LF (CUSTOM PROCEDURE TRAY) ×2 IMPLANT
PAD OB MATERNITY 4.3X12.25 (PERSONAL CARE ITEMS) ×2 IMPLANT
PAD PREP 24X48 CUFFED NSTRL (MISCELLANEOUS) ×2 IMPLANT
STRIP CLOSURE SKIN 1/2X4 (GAUZE/BANDAGES/DRESSINGS) ×1 IMPLANT
TOWEL OR 17X24 6PK STRL BLUE (TOWEL DISPOSABLE) ×4 IMPLANT
WATER STERILE IRR 1000ML POUR (IV SOLUTION) ×1 IMPLANT

## 2021-09-02 NOTE — Transfer of Care (Signed)
Immediate Anesthesia Transfer of Care Note  Patient: Rachel Villa  Procedure(s) Performed: DILATATION AND CURETTAGE  Patient Location: PACU  Anesthesia Type:General  Level of Consciousness: alert , oriented, drowsy and patient cooperative  Airway & Oxygen Therapy: Patient Spontanous Breathing and Patient connected to nasal cannula oxygen  Post-op Assessment: Report given to RN and Post -op Vital signs reviewed and stable   Post vital signs: Reviewed and stable  Last Vitals:  Vitals Value Taken Time  BP 93/47 09/02/21 2036  Temp    Pulse 80 09/02/21 2038  Resp 14 09/02/21 2038  SpO2 99 % 09/02/21 2038  Vitals shown include unvalidated device data.  Last Pain:  Vitals:   09/02/21 1815  TempSrc: Oral  PainSc:       Patients Stated Pain Goal: 4 (09/01/21 0344)  Complications: No notable events documented.

## 2021-09-02 NOTE — Anesthesia Preprocedure Evaluation (Addendum)
Anesthesia Evaluation  Patient identified by MRN, date of birth, ID band Patient awake    Reviewed: Allergy & Precautions, NPO status , Patient's Chart, lab work & pertinent test results  Airway Mallampati: III  TM Distance: >3 FB Neck ROM: Full    Dental no notable dental hx.    Pulmonary former smoker,    Pulmonary exam normal        Cardiovascular hypertension (PIH), Normal cardiovascular exam     Neuro/Psych negative neurological ROS  negative psych ROS   GI/Hepatic negative GI ROS, Neg liver ROS,   Endo/Other  negative endocrine ROS  Renal/GU negative Renal ROS     Musculoskeletal negative musculoskeletal ROS (+)   Abdominal   Peds  Hematology  (+) Blood dyscrasia, , Thrombocytopenia    Anesthesia Other Findings Repeat Cesarean Section IUGR  Reproductive/Obstetrics (+) Pregnancy                            Anesthesia Physical Anesthesia Plan  ASA: 3  Anesthesia Plan: Spinal   Post-op Pain Management:    Induction:   PONV Risk Score and Plan: 2 and Ondansetron, Dexamethasone and Treatment may vary due to age or medical condition  Airway Management Planned: Natural Airway  Additional Equipment:   Intra-op Plan:   Post-operative Plan:   Informed Consent: I have reviewed the patients History and Physical, chart, labs and discussed the procedure including the risks, benefits and alternatives for the proposed anesthesia with the patient or authorized representative who has indicated his/her understanding and acceptance.     Dental advisory given  Plan Discussed with: CRNA and Surgeon  Anesthesia Plan Comments:       Anesthesia Quick Evaluation

## 2021-09-02 NOTE — Op Note (Signed)
Cesarean Section Procedure Note  Indications:  41 y/o G3P0110  with h/o cesarean section currently 35 weeks and 0 days with preeclampsia with severe features. / IUGR  Pre-operative Diagnosis: 35 week 0 day pregnancy.  Post-operative Diagnosis: same  Surgeon: Gerald Leitz M.D.  Assistants: Dr. Leticia Penna assisted due to complexity of the anatomy.   Anesthesia: Spinal anesthesia  ASA Class: 2   Procedure Details   The patient was seen in the Holding Room. The risks, benefits, complications, treatment options, and expected outcomes were discussed with the patient.  The patient concurred with the proposed plan, giving informed consent.  The site of surgery properly noted/marked. The patient was taken to Operating Room , identified as Rachel Villa and the procedure verified as C-Section Delivery. A Time Out was held and the above information confirmed.  After induction of anesthesia, the patient was draped and prepped in the usual sterile manner. A Pfannenstiel incision was made and carried down through the subcutaneous tissue to the fascia. Fascial incision was made and extended transversely. The fascia was separated from the underlying rectus tissue superiorly and inferiorly. The peritoneum was identified and entered. Peritoneal incision was extended longitudinally. The utero-vesical peritoneal reflection was incised transversely and the bladder flap was bluntly freed from the lower uterine segment. A low transverse uterine incision was made. Delivered from cephalic presentation was a Female with Apgar scores of 9 at one minute and 9 at five minutes. After the umbilical cord was clamped and cut cord blood was obtained for evaluation. The placenta was removed intact and appeared normal. The uterine outline, tubes and ovaries appeared normal. The uterine incision was closed with running locked sutures of  0 monocryl . Hemostasis was observed. Lavage was carried out until clear. Interseed was placed  over the uterine incision.   The peritoneum was re-approximated with 2-0 vicryl. The fascia was then reapproximated with running sutures of  0 pds  . The skin was reapproximated with  4-0 vicryl .  Instrument, sponge, and needle counts were correct prior the abdominal closure and at the conclusion of the case.   Findings: Female infant in the cephalic presentation. Clear amniotic fluid  Estimated Blood Loss:  200 mL         Drains: None         Total IV Fluids:  Per anesthesia ml         Specimens: Placenta and Disposition:  Sent to Pathology          Implants: None         Complications:  None; patient tolerated the procedure well.         Disposition: PACU - hemodynamically stable.         Condition: stable  Attending Attestation: I performed the procedure.

## 2021-09-02 NOTE — Lactation Note (Signed)
This note was copied from a baby's chart. Lactation Consultation Note  Patient Name: Girl Geneiveve Deus [id number] Date: 09/02/2021   Age:43 hours  Lactation orders received and acknowledged. Mom is still in PACU at the end of this LC shift. NICU LC to follow up tomorrow for initial assessment.    Kael Keetch S Amar Keenum 09/02/2021, 6:54 PM

## 2021-09-02 NOTE — Transfer of Care (Signed)
Immediate Anesthesia Transfer of Care Note  Patient: Rachel Villa  Procedure(s) Performed: CESAREAN SECTION  Patient Location: PACU  Anesthesia Type:Spinal  Level of Consciousness: awake  Airway & Oxygen Therapy: Patient Spontanous Breathing  Post-op Assessment: Report given to RN  Post vital signs: Reviewed and stable  Last Vitals:  Vitals Value Taken Time  BP 138/91 09/02/21 1646  Temp    Pulse 46 09/02/21 1648  Resp 12 09/02/21 1648  SpO2 100 % 09/02/21 1648  Vitals shown include unvalidated device data.  Last Pain:  Vitals:   09/02/21 1408  TempSrc: Oral  PainSc:       Patients Stated Pain Goal: 4 (09/01/21 0344)  Complications: No notable events documented.

## 2021-09-02 NOTE — Progress Notes (Addendum)
Called to evaluate patient for low blood pressure and tachycardia. Per RN patient with change in vitals since returningto the PACU.  Blood pressures in 80s/50s, pulse in 140s.  A CBC had been ordered by anesthesia team.  On arrival at the PACU patient appeared pale, slowly responsive to questions and directions. As per RN blood loss in PACU after her D & C was about 100 cc in the Jada suction and peripad.   Patient reports feeling some incisional pain.    Objective: Vital signs in last 24 hours: Temp:  [94.2 F (34.6 C)-100.4 F (38 C)] 100.4 F (38 C) (06/27 2240) Pulse Rate:  [44-220] 132 (06/27 2250) Resp:  [8-26] 23 (06/27 2250) BP: (54-234)/(27-220) 134/108 (06/27 2250) SpO2:  [88 %-100 %] 100 % (06/27 2250) Weight:  [61.4 kg] 61.4 kg (06/27 0500)     09/02/2021   11:00 PM 09/02/2021   10:56 PM 09/02/2021   10:50 PM  Vitals with BMI  Systolic 137  134  Diastolic 99  108  Pulse 132 128 132  IV fluids 1.5 LR /2.5 hours Urine 175 cc/2.5 hrs  Physical Exam:  General: fatigued and mild distress Lochia: appropriate.  Peripad with scant blood.  Uterine Fundus: firm Incision: With honey comb dressing, with small blood on honey comb incision.  DVT Evaluation: No evidence of DVT seen on physical exam. No cords or calf tenderness.   Recent Labs    09/02/21 1947 09/02/21 2127  HGB 11.2* 8.8*  HCT 32.7* 27.3*    Assessment/Plan: POD # 0, Status post Cesarean section. Postoperative course complicated by postpartum hemorrhage (total EBL 1100cc which includes recent 100 cc in PACU) and s/p suction D & C and Jada balloon placement as well as uterotonics use,    -IV Phenylephrine and demerol given followed by  - 2 units of PRBC given as a bolus by anesthesia team in PACU and patient responded well.  At the end of 2 units patient was more awake and conversant.  She reported feeling better. I obtained a verbal consent form the patient to receive blood.   - Will f/u CBC in 2 hours.  -  C/w IV fluids at 125 cc/hr - Hold off on magnesium sulfate for now but if pressures start increasing will give magnesium sulfate for seizure prophylaxis.  Prescilla Sours, MD 09/02/2021, 11:06 PM

## 2021-09-02 NOTE — Op Note (Signed)
08/29/2021 - 09/02/2021  8:24 PM  PATIENT:  Rachel Villa  41 y.o. female .Marland Kitchen Called Emergently to Room S 105 due to reports of patient unresponsive  with increased vaginal bleeding (225 cc measured ) and hypotension BP 70/30 upon arrival. Pt was responsive upon my arrival. Upon assessment she was noted to have increased vaginal bleeding and large clots  could be felt in the vagina and lower uterine segment.  Due to lower uterine segment atony and hypotension 0.2 mg of Methergine was given IM. Pt had significant pain with attempts to remove the clots at the bedside. I recommended return to the operating room for D &C due to postpartum hemorrhage. R/B/A were discussed with the patient including but not limited to infection / perforation of the uterus with the need for further surgery. R/O transfusion was discussed. She voiced understanding and verbally consented to return to the operating room for Dilation and Curettage. Due to the emergent nature consent was not signed  prior to transfer to the OR.   PRE-OPERATIVE DIAGNOSIS:   Postpartum Hemorrhage  POST-OPERATIVE DIAGNOSIS:  Postpartum Hemorrhage / Uterine Atony  PROCEDURE:  Procedure(s): DILATATION AND CURETTAGE (N/A) Under ultrasound guidance  /Jada Placement.   SURGEON:  Surgeon(s) and Role:    * Gerald Leitz, MD - Primary    * Milas Hock, MD - Assisting with Mel Almond placement   PHYSICIAN ASSISTANT: None  ASSISTANTS: none   ANESTHESIA:   general  EBL:  600 mL   BLOOD ADMINISTERED:none  DRAINS: Urinary Catheter (Foley)   LOCAL MEDICATIONS USED:  NONE  SPECIMEN:  Source of Specimen:  Endometrial currettings   DISPOSITION OF SPECIMEN:  PATHOLOGY  COUNTS:  YES  TOURNIQUET:  * No tourniquets in log *  DICTATION: .Note written in EPIC  PLAN OF CARE: Admit to inpatient   PATIENT DISPOSITION:  Pt extubated and transported to PACU   Delay start of Pharmacological VTE agent (>24hrs) due to surgical blood loss or risk of bleeding:  not applicable  Findings: Large blood clots in the vaginal vault. Uterine atony of the lower uterine segment. The Fundus was firm. There did not appear to be any retained products within the endometrium by ultrasound.    Procedure: Pt was taken to operating room. She was placed under general anesthesia.  A time out was performed. She was identified as Transport planner . She was placed in the dorsal lithotomy position and prepped and draped in the usual sterile fashion.  A vaginal speculum was placed. The cervix was grasped anteriorly with ring forceps.  A 14 mm suction curette was introduced under ultrasound  guidance with extraction of blood from the fundus and lower uterine segment. Atony continued and was controlled with 1 amp of hemabate and 1000 mcg cytotec per vagina. The atony improved. The Jada uterine atony device was placed . The vaginal bulb was inflated with 80 cc of saline. The device was connected to low suction. Excellent hemostasis was noted.   Sponge lap and needle counts were correct times two.  Patient was transferred to the recovery room in stable  condition.

## 2021-09-03 ENCOUNTER — Ambulatory Visit: Payer: 59

## 2021-09-03 LAB — COMPREHENSIVE METABOLIC PANEL
ALT: 26 U/L (ref 0–44)
ALT: 38 U/L (ref 0–44)
ALT: 38 U/L (ref 0–44)
AST: 77 U/L — ABNORMAL HIGH (ref 15–41)
AST: 105 U/L — ABNORMAL HIGH (ref 15–41)
AST: 96 U/L — ABNORMAL HIGH (ref 15–41)
Albumin: 2.2 g/dL — ABNORMAL LOW (ref 3.5–5.0)
Albumin: 2.4 g/dL — ABNORMAL LOW (ref 3.5–5.0)
Albumin: 2.4 g/dL — ABNORMAL LOW (ref 3.5–5.0)
Alkaline Phosphatase: 174 U/L — ABNORMAL HIGH (ref 38–126)
Alkaline Phosphatase: 152 U/L — ABNORMAL HIGH (ref 38–126)
Alkaline Phosphatase: 153 U/L — ABNORMAL HIGH (ref 38–126)
Anion gap: 10 (ref 5–15)
Anion gap: 7 (ref 5–15)
Anion gap: 8 (ref 5–15)
BUN: 17 mg/dL (ref 6–20)
BUN: 21 mg/dL — ABNORMAL HIGH (ref 6–20)
BUN: 25 mg/dL — ABNORMAL HIGH (ref 6–20)
CO2: 20 mmol/L — ABNORMAL LOW (ref 22–32)
CO2: 23 mmol/L (ref 22–32)
CO2: 24 mmol/L (ref 22–32)
Calcium: 7.7 mg/dL — ABNORMAL LOW (ref 8.9–10.3)
Calcium: 8.1 mg/dL — ABNORMAL LOW (ref 8.9–10.3)
Calcium: 8.2 mg/dL — ABNORMAL LOW (ref 8.9–10.3)
Chloride: 105 mmol/L (ref 98–111)
Chloride: 106 mmol/L (ref 98–111)
Chloride: 105 mmol/L (ref 98–111)
Creatinine, Ser: 1.71 mg/dL — ABNORMAL HIGH (ref 0.44–1.00)
Creatinine, Ser: 1.73 mg/dL — ABNORMAL HIGH (ref 0.44–1.00)
Creatinine, Ser: 1.49 mg/dL — ABNORMAL HIGH (ref 0.44–1.00)
GFR, Estimated: 38 mL/min — ABNORMAL LOW (ref >60)
GFR, Estimated: 37 mL/min — ABNORMAL LOW (ref >60)
GFR, Estimated: 45 mL/min — ABNORMAL LOW (ref >60)
Glucose, Bld: 111 mg/dL — ABNORMAL HIGH (ref 70–99)
Glucose, Bld: 114 mg/dL — ABNORMAL HIGH (ref 70–99)
Glucose, Bld: 95 mg/dL (ref 70–99)
Potassium: 4.9 mmol/L (ref 3.5–5.1)
Potassium: 4.7 mmol/L (ref 3.5–5.1)
Potassium: 4.8 mmol/L (ref 3.5–5.1)
Sodium: 135 mmol/L (ref 135–145)
Sodium: 136 mmol/L (ref 135–145)
Sodium: 137 mmol/L (ref 135–145)
Total Bilirubin: 1.1 mg/dL (ref 0.3–1.2)
Total Bilirubin: 0.4 mg/dL (ref 0.3–1.2)
Total Bilirubin: 0.6 mg/dL (ref 0.3–1.2)
Total Protein: 4.5 g/dL — ABNORMAL LOW (ref 6.5–8.1)
Total Protein: 4.7 g/dL — ABNORMAL LOW (ref 6.5–8.1)
Total Protein: 4.7 g/dL — ABNORMAL LOW (ref 6.5–8.1)

## 2021-09-03 LAB — CBC WITH DIFFERENTIAL/PLATELET
Abs Immature Granulocytes: 1.01 10*3/uL — ABNORMAL HIGH (ref 0.00–0.07)
Abs Immature Granulocytes: 0.29 10*3/uL — ABNORMAL HIGH (ref 0.00–0.07)
Abs Immature Granulocytes: 0.2 10*3/uL — ABNORMAL HIGH (ref 0.00–0.07)
Basophils Absolute: 0.1 10*3/uL (ref 0.0–0.1)
Basophils Absolute: 0 10*3/uL (ref 0.0–0.1)
Basophils Absolute: 0 10*3/uL (ref 0.0–0.1)
Basophils Relative: 0 %
Basophils Relative: 0 %
Basophils Relative: 0 %
Eosinophils Absolute: 0 10*3/uL (ref 0.0–0.5)
Eosinophils Absolute: 0 10*3/uL (ref 0.0–0.5)
Eosinophils Absolute: 0 10*3/uL (ref 0.0–0.5)
Eosinophils Relative: 0 %
Eosinophils Relative: 0 %
Eosinophils Relative: 0 %
HCT: 38.9 % (ref 36.0–46.0)
HCT: 29.8 % — ABNORMAL LOW (ref 36.0–46.0)
HCT: 28 % — ABNORMAL LOW (ref 36.0–46.0)
Hemoglobin: 13.9 g/dL (ref 12.0–15.0)
Hemoglobin: 10.8 g/dL — ABNORMAL LOW (ref 12.0–15.0)
Hemoglobin: 9.9 g/dL — ABNORMAL LOW (ref 12.0–15.0)
Immature Granulocytes: 3 %
Immature Granulocytes: 1 %
Immature Granulocytes: 1 %
Lymphocytes Relative: 3 %
Lymphocytes Relative: 4 %
Lymphocytes Relative: 5 %
Lymphs Abs: 1 10*3/uL (ref 0.7–4.0)
Lymphs Abs: 1.2 10*3/uL (ref 0.7–4.0)
Lymphs Abs: 1.2 10*3/uL (ref 0.7–4.0)
MCH: 31.2 pg (ref 26.0–34.0)
MCH: 31.5 pg (ref 26.0–34.0)
MCH: 31.2 pg (ref 26.0–34.0)
MCHC: 35.7 g/dL (ref 30.0–36.0)
MCHC: 36.2 g/dL — ABNORMAL HIGH (ref 30.0–36.0)
MCHC: 35.4 g/dL (ref 30.0–36.0)
MCV: 87.4 fL (ref 80.0–100.0)
MCV: 86.9 fL (ref 80.0–100.0)
MCV: 88.3 fL (ref 80.0–100.0)
Monocytes Absolute: 1.2 10*3/uL — ABNORMAL HIGH (ref 0.1–1.0)
Monocytes Absolute: 1.2 10*3/uL — ABNORMAL HIGH (ref 0.1–1.0)
Monocytes Absolute: 1 10*3/uL (ref 0.1–1.0)
Monocytes Relative: 4 %
Monocytes Relative: 4 %
Monocytes Relative: 4 %
Neutro Abs: 27.3 10*3/uL — ABNORMAL HIGH (ref 1.7–7.7)
Neutro Abs: 26.9 10*3/uL — ABNORMAL HIGH (ref 1.7–7.7)
Neutro Abs: 20.5 10*3/uL — ABNORMAL HIGH (ref 1.7–7.7)
Neutrophils Relative %: 90 %
Neutrophils Relative %: 91 %
Neutrophils Relative %: 90 %
Platelets: 71 10*3/uL — ABNORMAL LOW (ref 150–400)
Platelets: DECREASED 10*3/uL (ref 150–400)
Platelets: 67 10*3/uL — ABNORMAL LOW (ref 150–400)
RBC: 4.45 MIL/uL (ref 3.87–5.11)
RBC: 3.43 MIL/uL — ABNORMAL LOW (ref 3.87–5.11)
RBC: 3.17 MIL/uL — ABNORMAL LOW (ref 3.87–5.11)
RDW: 13.6 % (ref 11.5–15.5)
RDW: 14.4 % (ref 11.5–15.5)
RDW: 14.6 % (ref 11.5–15.5)
WBC: 30.6 10*3/uL — ABNORMAL HIGH (ref 4.0–10.5)
WBC: 29.6 10*3/uL — ABNORMAL HIGH (ref 4.0–10.5)
WBC: 22.9 10*3/uL — ABNORMAL HIGH (ref 4.0–10.5)
nRBC: 1.1 % — ABNORMAL HIGH (ref 0.0–0.2)
nRBC: 0.2 % (ref 0.0–0.2)
nRBC: 0.1 % (ref 0.0–0.2)

## 2021-09-03 LAB — TYPE AND SCREEN
ABO/RH(D): B POS
Antibody Screen: NEGATIVE
Unit division: 0
Unit division: 0

## 2021-09-03 LAB — BPAM RBC
Blood Product Expiration Date: 202307182359
Blood Product Expiration Date: 202307182359
ISSUE DATE / TIME: 202306272159
ISSUE DATE / TIME: 202306272159
Unit Type and Rh: 7300
Unit Type and Rh: 7300

## 2021-09-03 LAB — MAGNESIUM: Magnesium: 2.9 mg/dL — ABNORMAL HIGH (ref 1.7–2.4)

## 2021-09-03 NOTE — Progress Notes (Signed)
Patient screened out for psychosocial assessment since none of the following apply: °Psychosocial stressors documented in mother or baby's chart °Gestation less than 32 weeks °Code at delivery  °Infant with anomalies °Please contact the Clinical Social Worker if specific needs arise, by MOB's request, or if MOB scores greater than 9/yes to question 10 on Edinburgh Postpartum Depression Screen. ° °Nicholos Aloisi Boyd-Gilyard, MSW, LCSW °Clinical Social Work °[phone] °  °

## 2021-09-03 NOTE — Progress Notes (Signed)
Subjective: Postpartum Day 1 s/p : Cesarean Delivery  followed by return to OR for D&C to manage postpartum hemorrhage.   Patient reports incisional pain and tolerating PO.  Foley catheter is still in place. She denies headache, visual disturbances or RUQ pain. The Mel Almond was removed from suction last night around 10 pm.  There has been normal lochia since suction was removed.   Objective: Vital signs in last 24 hours: Temp:  [94.2 F (34.6 C)-100.4 F (38 C)] 97.8 F (36.6 C) (06/28 1700) Pulse Rate:  [55-220] 58 (06/28 1700) Resp:  [13-26] 20 (06/28 1700) BP: (54-234)/(27-220) 143/91 (06/28 1700) SpO2:  [88 %-100 %] 100 % (06/28 1700)  Physical Exam:  General: alert, cooperative, and no distress Lochia: appropriate Uterine Fundus: firm Incision: healing well DVT Evaluation: No evidence of DVT seen on physical exam.  Recent Labs    09/03/21 0133 09/03/21 1522  HGB 13.9 10.8*  HCT 38.9 29.8*   Results for orders placed or performed during the hospital encounter of 08/29/21 (from the past 24 hour(s))  CBC     Status: Abnormal   Collection Time: 09/02/21  7:47 PM  Result Value Ref Range   WBC 31.8 (H) 4.0 - 10.5 K/uL   RBC 3.54 (L) 3.87 - 5.11 MIL/uL   Hemoglobin 11.2 (L) 12.0 - 15.0 g/dL   HCT 32.2 (L) 02.5 - 42.7 %   MCV 92.4 80.0 - 100.0 fL   MCH 31.6 26.0 - 34.0 pg   MCHC 34.3 30.0 - 36.0 g/dL   RDW 06.2 37.6 - 28.3 %   Platelets  150 - 400 K/uL    PLATELET CLUMPS NOTED ON SMEAR, COUNT APPEARS DECREASED   nRBC 0.7 (H) 0.0 - 0.2 %  APTT     Status: None   Collection Time: 09/02/21  7:47 PM  Result Value Ref Range   aPTT 32 24 - 36 seconds  Protime-INR     Status: Abnormal   Collection Time: 09/02/21  7:47 PM  Result Value Ref Range   Prothrombin Time 16.5 (H) 11.4 - 15.2 seconds   INR 1.3 (H) 0.8 - 1.2  Fibrinogen     Status: Abnormal   Collection Time: 09/02/21  7:47 PM  Result Value Ref Range   Fibrinogen 131 (L) 210 - 475 mg/dL  Comprehensive metabolic  panel     Status: Abnormal   Collection Time: 09/02/21  7:47 PM  Result Value Ref Range   Sodium 134 (L) 135 - 145 mmol/L   Potassium 5.1 3.5 - 5.1 mmol/L   Chloride 104 98 - 111 mmol/L   CO2 20 (L) 22 - 32 mmol/L   Glucose, Bld 94 70 - 99 mg/dL   BUN 14 6 - 20 mg/dL   Creatinine, Ser 1.51 (H) 0.44 - 1.00 mg/dL   Calcium 7.9 (L) 8.9 - 10.3 mg/dL   Total Protein 4.5 (L) 6.5 - 8.1 g/dL   Albumin 2.1 (L) 3.5 - 5.0 g/dL   AST 36 15 - 41 U/L   ALT 15 0 - 44 U/L   Alkaline Phosphatase 210 (H) 38 - 126 U/L   Total Bilirubin 0.8 0.3 - 1.2 mg/dL   GFR, Estimated 55 (L) >60 mL/min   Anion gap 10 5 - 15  CBC     Status: Abnormal   Collection Time: 09/02/21  9:27 PM  Result Value Ref Range   WBC 38.3 (H) 4.0 - 10.5 K/uL   RBC 2.86 (L) 3.87 - 5.11 MIL/uL  Hemoglobin 8.8 (L) 12.0 - 15.0 g/dL   HCT 03.5 (L) 00.9 - 38.1 %   MCV 95.5 80.0 - 100.0 fL   MCH 30.8 26.0 - 34.0 pg   MCHC 32.2 30.0 - 36.0 g/dL   RDW 82.9 93.7 - 16.9 %   Platelets 119 (L) 150 - 400 K/uL   nRBC 0.9 (H) 0.0 - 0.2 %  Prepare RBC (crossmatch)     Status: None   Collection Time: 09/02/21  9:53 PM  Result Value Ref Range   Order Confirmation      ORDER PROCESSED BY BLOOD BANK Performed at [facility], [address] [place], [place] [address]   CBC with Differential/Platelet     Status: Abnormal   Collection Time: 09/03/21  1:33 AM  Result Value Ref Range   WBC 30.6 (H) 4.0 - 10.5 K/uL   RBC 4.45 3.87 - 5.11 MIL/uL   Hemoglobin 13.9 12.0 - 15.0 g/dL   HCT 81.0 17.5 - 10.2 %   MCV 87.4 80.0 - 100.0 fL   MCH 31.2 26.0 - 34.0 pg   MCHC 35.7 30.0 - 36.0 g/dL   RDW 58.5 27.7 - 82.4 %   Platelets 71 (L) 150 - 400 K/uL   nRBC 1.1 (H) 0.0 - 0.2 %   Neutrophils Relative % 90 %   Neutro Abs 27.3 (H) 1.7 - 7.7 K/uL   Lymphocytes Relative 3 %   Lymphs Abs 1.0 0.7 - 4.0 K/uL   Monocytes Relative 4 %   Monocytes Absolute 1.2 (H) 0.1 - 1.0 K/uL   Eosinophils Relative 0 %   Eosinophils Absolute 0.0 0.0 - 0.5  K/uL   Basophils Relative 0 %   Basophils Absolute 0.1 0.0 - 0.1 K/uL   Immature Granulocytes 3 %   Abs Immature Granulocytes 1.01 (H) 0.00 - 0.07 K/uL  Comprehensive metabolic panel     Status: Abnormal   Collection Time: 09/03/21  1:33 AM  Result Value Ref Range   Sodium 135 135 - 145 mmol/L   Potassium 4.9 3.5 - 5.1 mmol/L   Chloride 105 98 - 111 mmol/L   CO2 20 (L) 22 - 32 mmol/L   Glucose, Bld 111 (H) 70 - 99 mg/dL   BUN 17 6 - 20 mg/dL   Creatinine, Ser 2.35 (H) 0.44 - 1.00 mg/dL   Calcium 7.7 (L) 8.9 - 10.3 mg/dL   Total Protein 4.5 (L) 6.5 - 8.1 g/dL   Albumin 2.2 (L) 3.5 - 5.0 g/dL   AST 77 (H) 15 - 41 U/L   ALT 26 0 - 44 U/L   Alkaline Phosphatase 174 (H) 38 - 126 U/L   Total Bilirubin 1.1 0.3 - 1.2 mg/dL   GFR, Estimated 38 (L) >60 mL/min   Anion gap 10 5 - 15  CBC with Differential/Platelet     Status: Abnormal   Collection Time: 09/03/21  3:22 PM  Result Value Ref Range   WBC 29.6 (H) 4.0 - 10.5 K/uL   RBC 3.43 (L) 3.87 - 5.11 MIL/uL   Hemoglobin 10.8 (L) 12.0 - 15.0 g/dL   HCT 36.1 (L) 44.3 - 15.4 %   MCV 86.9 80.0 - 100.0 fL   MCH 31.5 26.0 - 34.0 pg   MCHC 36.2 (H) 30.0 - 36.0 g/dL   RDW 00.8 67.6 - 19.5 %   Platelets  150 - 400 K/uL    PLATELET CLUMPS NOTED ON SMEAR, COUNT APPEARS DECREASED   nRBC 0.2 0.0 -  0.2 %   Neutrophils Relative % 91 %   Neutro Abs 26.9 (H) 1.7 - 7.7 K/uL   Lymphocytes Relative 4 %   Lymphs Abs 1.2 0.7 - 4.0 K/uL   Monocytes Relative 4 %   Monocytes Absolute 1.2 (H) 0.1 - 1.0 K/uL   Eosinophils Relative 0 %   Eosinophils Absolute 0.0 0.0 - 0.5 K/uL   Basophils Relative 0 %   Basophils Absolute 0.0 0.0 - 0.1 K/uL   Immature Granulocytes 1 %   Abs Immature Granulocytes 0.29 (H) 0.00 - 0.07 K/uL  Comprehensive metabolic panel     Status: Abnormal   Collection Time: 09/03/21  3:22 PM  Result Value Ref Range   Sodium 136 135 - 145 mmol/L   Potassium 4.7 3.5 - 5.1 mmol/L   Chloride 106 98 - 111 mmol/L   CO2 23 22 - 32 mmol/L    Glucose, Bld 114 (H) 70 - 99 mg/dL   BUN 21 (H) 6 - 20 mg/dL   Creatinine, Ser 1.60 (H) 0.44 - 1.00 mg/dL   Calcium 8.1 (L) 8.9 - 10.3 mg/dL   Total Protein 4.7 (L) 6.5 - 8.1 g/dL   Albumin 2.4 (L) 3.5 - 5.0 g/dL   AST 737 (H) 15 - 41 U/L   ALT 38 0 - 44 U/L   Alkaline Phosphatase 152 (H) 38 - 126 U/L   Total Bilirubin 0.4 0.3 - 1.2 mg/dL   GFR, Estimated 37 (L) >60 mL/min   Anion gap 7 5 - 15  Magnesium     Status: Abnormal   Collection Time: 09/03/21  3:22 PM  Result Value Ref Range   Magnesium 2.9 (H) 1.7 - 2.4 mg/dL    Assessment/Plan: Status post Cesarean section. Dilation and Curettage to manage  Postpartum hemorrhage  - BP is slightly elevated currently if remains greater than 140/90 start procardia XL 30 mg daily .Marland Kitchen Avoid labetalol due to Low base line heart rate.  - increased Creatinine may be result of prolonged hypotension after PPH- will repeat labs at 10 pm. If they are not trending down recommend starting magnesium sulfate  with 2 gram bolus and run at 1 gram an hour.  - also start magnesium sulfate if pt develops persistent headache or visual disturbances.  - pain control .Marland Kitchen Recommend avoiding NSAIDS due to Significant thrombocytopenia.. plan tylenol scheduled and oxycodone prn for pain control. - [facility] covering after 7 pm this evening.    Gerald Leitz, MD 09/03/2021, 6:27 PM

## 2021-09-03 NOTE — Progress Notes (Signed)
"  Jada" removed.  Small amount lochia, no clots. Patient tolerated without difficulty.

## 2021-09-03 NOTE — Lactation Note (Signed)
This note was copied from a baby's chart.  NICU Lactation Consultation Note  Patient Name: Girl Karen Huhta YIFOY'D Date: 09/03/2021 Age:43 hours   Subjective Reason for consult: Initial assessment Mother denies hx of breast surgery/trauma. She ordered a Medela pump through her insurance provider last week. She is yet to receive it.   Mother has not begun pumping; pump is set up in room. I provided written information, briefly reviewed risk of delay of copious milk d/t pph, and offered to assist with pumping. Mother declined at this time and requested I return at 3pm. I agreed.   Objective Infant data: Mother's Current Feeding Choice: Breast Milk and Donor Milk  Infant feeding assessment Scale for Readiness: 2 Scale for Quality: 3   Maternal data: G3P0110  C-Section, Low Transverse  Does the patient have breastfeeding experience prior to this delivery?: No  Risk factor for low milk supply:: pph, delay in initiating pumping, AMA, Pre-E   Assessment Maternal: Mother is at risk for low milk supply.  Intervention/Plan Interventions: [employer] brochure; Education  Plan: Consult Status: NICU follow-up  NICU Follow-up type: New admission follow up; Verify absence of engorgement; Maternal D/C visit; Weekly NICU follow up; Verify onset of copious milk; Assist with IDF-1 (Mother to pre-pump before breastfeeding); Assist with IDF-2 (Mother does not need to pre-pump before breastfeeding)    Elder Negus 09/03/2021, 10:32 AM

## 2021-09-03 NOTE — Lactation Note (Signed)
This note was copied from a baby's chart.  NICU Lactation Consultation Note  Patient Name: Rachel Villa WGYKZ'L Date: 09/03/2021 Age:43 hours   Subjective Reason for consult: Initial assessment LC returned to [facility] and provided pump ed/ pumping assistance. Mother is awaiting her insurance-issued breast pump. We reviewed loaner and rental pumps prn. I assisted with initial pumping. Mother denies hx of breast surgery/trauma.   Objective Infant data: Mother's Current Feeding Choice: Breast Milk and Donor Milk  Infant feeding assessment Scale for Readiness: 2 Scale for Quality: 3    Maternal data: G3P0110  C-Section, Low Transverse Does the patient have breastfeeding experience prior to this delivery?: No  Risk factor for low milk supply:: pph, delay in initiating pumping, AMA, Pre-E    Intervention/Plan Interventions: Education; DEBP; Infant Driven Feeding Algorithm education; [employer] brochure; "The NICU and Your Baby" book  Plan: Consult Status: NICU follow-up  NICU Follow-up type: New admission follow up; Verify absence of engorgement; Weekly NICU follow up; Maternal D/C visit; Verify onset of copious milk    Elder Negus 09/03/2021, 5:43 PM

## 2021-09-03 NOTE — Anesthesia Postprocedure Evaluation (Signed)
Anesthesia Post Note  Patient: Rachel Villa  Procedure(s) Performed: CESAREAN SECTION     Patient location during evaluation: PACU Anesthesia Type: Spinal Level of consciousness: awake Pain management: pain level controlled Vital Signs Assessment: post-procedure vital signs reviewed and stable Respiratory status: spontaneous breathing, nonlabored ventilation, respiratory function stable and patient connected to nasal cannula oxygen Cardiovascular status: stable and blood pressure returned to baseline Postop Assessment: no apparent nausea or vomiting Anesthetic complications: no   No notable events documented.  Last Vitals:  Vitals:   09/03/21 1301 09/03/21 1700  BP: 134/79 (!) 143/91  Pulse: (!) 58 (!) 58  Resp: 20 20  Temp: 36.6 C 36.6 C  SpO2: 99% 100%    Last Pain:  Vitals:   09/03/21 1740  TempSrc:   PainSc: 2                  Rachel Villa

## 2021-09-03 NOTE — Anesthesia Postprocedure Evaluation (Signed)
Anesthesia Post Note  Patient: Rachel Villa  Procedure(s) Performed: DILATATION AND CURETTAGE     Patient location during evaluation: PACU Anesthesia Type: General Level of consciousness: sedated and patient cooperative Pain management: pain level controlled Vital Signs Assessment: post-procedure vital signs reviewed and stable Respiratory status: spontaneous breathing Cardiovascular status: stable Anesthetic complications: no Comments: Long PACU course. Pt brought emergently to OR. No mention of post partum hemorrhage. Dr. Richardson Dopp mentioned she saw intrauterine blood on the ultrasound. Minimal EBL in CS and PACU. No EBL mentioned from post partum. Intraop she was volume resuscitated and required intermittent bolus vasopressor support. EBP ~700 per CRNA. She was taken to PACU. BP remained soft, and patient looked pale. CBC from OR showed hgb of 11, down from 13. I recommended volume. She continued to have marginal BP, and HR began to climb. Pt temp also quite low. Eventually, I requested a repeat CBC, to cross match 2 units and to notify the OB that she needs PRBCs. 2u PRBCs given rapidly by me and she showed marked improvement in level of consciousness and BP. HR began trending down prior to d/c from PACU.    No notable events documented.  Last Vitals:  Vitals:   09/02/21 2325 09/03/21 0000  BP: 130/90 129/81  Pulse: (!) 105 (!) 102  Resp: 18 18  Temp: 36.7 C   SpO2: 99% 97%    Last Pain:  Vitals:   09/02/21 2325  TempSrc: Oral  PainSc:    Pain Goal: Patients Stated Pain Goal: 4 (09/01/21 0344)                 Lewie Loron

## 2021-09-03 NOTE — Progress Notes (Signed)
Report received from Melburn Popper, [place] @ [phone]

## 2021-09-04 ENCOUNTER — Encounter (HOSPITAL_COMMUNITY): Payer: Self-pay | Admitting: Obstetrics and Gynecology

## 2021-09-04 LAB — COMPREHENSIVE METABOLIC PANEL
ALT: 39 U/L (ref 0–44)
AST: 86 U/L — ABNORMAL HIGH (ref 15–41)
Albumin: 2.3 g/dL — ABNORMAL LOW (ref 3.5–5.0)
Alkaline Phosphatase: 133 U/L — ABNORMAL HIGH (ref 38–126)
Anion gap: 7 (ref 5–15)
BUN: 25 mg/dL — ABNORMAL HIGH (ref 6–20)
CO2: 24 mmol/L (ref 22–32)
Calcium: 8.4 mg/dL — ABNORMAL LOW (ref 8.9–10.3)
Chloride: 108 mmol/L (ref 98–111)
Creatinine, Ser: 1.45 mg/dL — ABNORMAL HIGH (ref 0.44–1.00)
GFR, Estimated: 46 mL/min — ABNORMAL LOW (ref >60)
Glucose, Bld: 87 mg/dL (ref 70–99)
Potassium: 4.7 mmol/L (ref 3.5–5.1)
Sodium: 139 mmol/L (ref 135–145)
Total Bilirubin: 0.9 mg/dL (ref 0.3–1.2)
Total Protein: 4.6 g/dL — ABNORMAL LOW (ref 6.5–8.1)

## 2021-09-04 LAB — CBC WITH DIFFERENTIAL/PLATELET
Abs Immature Granulocytes: 0.23 10*3/uL — ABNORMAL HIGH (ref 0.00–0.07)
Basophils Absolute: 0 10*3/uL (ref 0.0–0.1)
Basophils Relative: 0 %
Eosinophils Absolute: 0 10*3/uL (ref 0.0–0.5)
Eosinophils Relative: 0 %
HCT: 27.4 % — ABNORMAL LOW (ref 36.0–46.0)
Hemoglobin: 9.8 g/dL — ABNORMAL LOW (ref 12.0–15.0)
Immature Granulocytes: 1 %
Lymphocytes Relative: 7 %
Lymphs Abs: 1.4 10*3/uL (ref 0.7–4.0)
MCH: 31.1 pg (ref 26.0–34.0)
MCHC: 35.8 g/dL (ref 30.0–36.0)
MCV: 87 fL (ref 80.0–100.0)
Monocytes Absolute: 0.8 10*3/uL (ref 0.1–1.0)
Monocytes Relative: 4 %
Neutro Abs: 16.7 10*3/uL — ABNORMAL HIGH (ref 1.7–7.7)
Neutrophils Relative %: 88 %
Platelets: 65 10*3/uL — ABNORMAL LOW (ref 150–400)
RBC: 3.15 MIL/uL — ABNORMAL LOW (ref 3.87–5.11)
RDW: 14.6 % (ref 11.5–15.5)
WBC: 19.2 10*3/uL — ABNORMAL HIGH (ref 4.0–10.5)
nRBC: 0.3 % — ABNORMAL HIGH (ref 0.0–0.2)

## 2021-09-04 LAB — MAGNESIUM: Magnesium: 4.9 mg/dL — ABNORMAL HIGH (ref 1.7–2.4)

## 2021-09-04 LAB — SURGICAL PATHOLOGY

## 2021-09-04 MED ORDER — LACTATED RINGERS IV SOLN: INTRAVENOUS | Status: DC

## 2021-09-04 MED ORDER — MAGNESIUM SULFATE 40 GM/1000ML IV SOLN
1.0000 g/h | INTRAVENOUS | Status: DC
  Administered 2021-09-04: 1 g/h via INTRAVENOUS
  Filled 2021-09-04 (×2): qty 1000

## 2021-09-04 MED ORDER — MAGNESIUM SULFATE BOLUS VIA INFUSION
4.0000 g | Freq: Once | INTRAVENOUS | Status: AC
Start: 2021-09-04 — End: 2021-09-04
  Administered 2021-09-04: 4 g via INTRAVENOUS
  Filled 2021-09-04: qty 1000

## 2021-09-04 MED ORDER — HYDRALAZINE HCL 20 MG/ML IJ SOLN: 10.0000 mg | INTRAMUSCULAR | Status: DC | PRN

## 2021-09-04 MED ORDER — LABETALOL HCL 5 MG/ML IV SOLN: 40.0000 mg | INTRAVENOUS | Status: DC | PRN

## 2021-09-04 MED ORDER — LABETALOL HCL 5 MG/ML IV SOLN: 20.0000 mg | INTRAVENOUS | Status: DC | PRN

## 2021-09-04 MED ORDER — HYDRALAZINE HCL 20 MG/ML IJ SOLN: 5.0000 mg | INTRAMUSCULAR | Status: DC | PRN

## 2021-09-04 MED ORDER — NIFEDIPINE ER OSMOTIC RELEASE 30 MG PO TB24
30.0000 mg | ORAL_TABLET | Freq: Every day | ORAL | Status: DC
  Administered 2021-09-04 – 2021-09-06 (×3): 30 mg via ORAL
  Filled 2021-09-04 (×3): qty 1

## 2021-09-04 NOTE — Progress Notes (Signed)
Postpartum Note Day #2  S:  Patient doing well. Reports increasing incisional pain today - she suspects this has lead to an increase in her blood pressures in some way. Tolerating a regular diet.   Ambulating and voiding without difficulty. Denies fevers, chills, chest pain, SOB, N/V, or worsening bilateral LE edema.  Lochia: Minimal Infant feeding:  Pumping Circumcision:  N/A, female infant Contraception:  To be addresed at 6 week PPV  O: Temp:  [97.7 F (36.5 C)-98.4 F (36.9 C)] 97.7 F (36.5 C) (06/29 0444) Pulse Rate:  [58-65] 62 (06/29 0444) Resp:  [18-20] 18 (06/29 0444) BP: (130-149)/(78-95) 149/92 (06/29 0444) SpO2:  [99 %-100 %] 100 % (06/29 0444) Gen: NAD, pleasant and cooperative CV: RRR Resp: CTAB, no wheezes/rales/rhonchi Abdomen: soft, non-distended, non-tender throughout Uterus: firm, non-tender, below umbilicus Incision: honeycomb dressing in place with area of saturation which is marked Ext: No bilateral LE edema, no bilateral calf tenderness, SCDs on and working  Labs:     Latest Ref Rng & Units 09/04/2021    4:30 AM 09/03/2021   10:09 PM 09/03/2021    3:22 PM  CBC  WBC 4.0 - 10.5 K/uL 19.2  22.9  29.6   Hemoglobin 12.0 - 15.0 g/dL 9.8  9.9  78.9   Hematocrit 36.0 - 46.0 % 27.4  28.0  29.8   Platelets 150 - 400 K/uL 65  67  PLATELET CLUMPS NOTED ON SMEAR, COUNT APPEARS DECREASED       Latest Ref Rng & Units 09/04/2021    4:30 AM 09/03/2021   10:09 PM 09/03/2021    3:22 PM  CMP  Glucose 70 - 99 mg/dL 87  95  381   BUN 6 - 20 mg/dL 25  25  21    Creatinine 0.44 - 1.00 mg/dL  0.17  5.10   Sodium 135 - 145 mmol/L 139  137  136   Potassium 3.5 - 5.1 mmol/L 4.7  4.8  4.7   Chloride 98 - 111 mmol/L 108  105  106   CO2 22 - 32 mmol/L 24  24  23    Calcium 8.9 - 10.3 mg/dL 8.4  8.2  8.1   Total Protein 6.5 - 8.1 g/dL 4.6  4.7  4.7   Total Bilirubin 0.3 - 1.2 mg/dL 0.9  0.6  0.4   Alkaline Phos 38 - 126 U/L 133  153  152   AST 15 - 41 U/L 86  96  105   ALT  0 - 44 U/L 39  38  38      A/P: Patient is a 41 y.o. G3P0110 POD#2 s/p LTCS for preeclampsia with severe features (thrombocytopenia and mild range blood pressures) and POD#2 s/p Suction D&C for postpartum hemorrhage.  S/p LTCS - Pain well controlled  - GU: UOP is adequate - GI: Tolerating regular diet - Activity: encouraged sitting up to chair and ambulation as tolerated - DVT Prophylaxis: SCDs, ambulation - Labs: as above  Preeclampsia with severe features - Based on mild range blood pressures and thrombocytopenia - Blood pressures have been more elevated (higher mild ranges) over the last day - Medication: Procardia XL 30mg  daily (started now) - Preeclampsia labs:      - Creatinine: 1.71 (after delivery) -->> 1.45      - AST/ALT: 105/38 -->> 86/39      - Platelets: 97 (just before delivery) --> 71 --> 67 --> 65 - Magnesium sulfate was previously discontinued due to significant hypotension after PPH,  recommend re-initiation now due to rise in blood pressures given patient's history of eclampsia  Postpartum hemorrhage - S/p suction D&C and Jada placement - Received Cytotec, Methergine, TXA, and Hemabate - S/p 2u pRBCs, Hgb stable at 9.8  - Lochia minimal  Extensive discussion was had with patient regarding risks/benefits of initiating Magnesium sulfate at this stage beyond 24 hours. Given rise in her blood pressures and previous lab abnormalities (which are improving), recommend Magnesium sulfate x 24 hours for seizure prophylaxis given her history of eclampsia.  Disposition:  D/C home likely tomorrow, pending blood pressures   Steva Ready, DO [phone] (office)

## 2021-09-04 NOTE — Progress Notes (Signed)
Patient requested to use a PureWick during the night due to frequent voids on fluids and Magnesium. RN talked to her about use of a PureWick, ambulating, need to urinate to prevent Magnesium Toxicity. PureWick being placed based on patient request. Raelyn Ensign, RN

## 2021-09-05 LAB — COMPREHENSIVE METABOLIC PANEL
ALT: 53 U/L — ABNORMAL HIGH (ref 0–44)
AST: 74 U/L — ABNORMAL HIGH (ref 15–41)
Albumin: 2.4 g/dL — ABNORMAL LOW (ref 3.5–5.0)
Alkaline Phosphatase: 129 U/L — ABNORMAL HIGH (ref 38–126)
Anion gap: 9 (ref 5–15)
BUN: 13 mg/dL (ref 6–20)
CO2: 26 mmol/L (ref 22–32)
Calcium: 8 mg/dL — ABNORMAL LOW (ref 8.9–10.3)
Chloride: 103 mmol/L (ref 98–111)
Creatinine, Ser: 1.13 mg/dL — ABNORMAL HIGH (ref 0.44–1.00)
GFR, Estimated: >60 mL/min (ref >60)
Glucose, Bld: 96 mg/dL (ref 70–99)
Potassium: 4 mmol/L (ref 3.5–5.1)
Sodium: 138 mmol/L (ref 135–145)
Total Bilirubin: 0.6 mg/dL (ref 0.3–1.2)
Total Protein: 5 g/dL — ABNORMAL LOW (ref 6.5–8.1)

## 2021-09-05 LAB — CBC
HCT: 27.3 % — ABNORMAL LOW (ref 36.0–46.0)
Hemoglobin: 9.3 g/dL — ABNORMAL LOW (ref 12.0–15.0)
MCH: 30.2 pg (ref 26.0–34.0)
MCHC: 34.1 g/dL (ref 30.0–36.0)
MCV: 88.6 fL (ref 80.0–100.0)
Platelets: 78 10*3/uL — ABNORMAL LOW (ref 150–400)
RBC: 3.08 MIL/uL — ABNORMAL LOW (ref 3.87–5.11)
RDW: 14.3 % (ref 11.5–15.5)
WBC: 15.8 10*3/uL — ABNORMAL HIGH (ref 4.0–10.5)
nRBC: 0.3 % — ABNORMAL HIGH (ref 0.0–0.2)

## 2021-09-05 LAB — LACTATE DEHYDROGENASE: LDH: 377 U/L — ABNORMAL HIGH (ref 98–192)

## 2021-09-05 MED ORDER — OXYCODONE HCL 5 MG PO TABS: 5.0000 mg | ORAL_TABLET | ORAL | 0 refills | Status: DC | PRN

## 2021-09-05 MED ORDER — NIFEDIPINE ER 30 MG PO TB24: 30.0000 mg | ORAL_TABLET | Freq: Every day | ORAL | 1 refills | Status: DC

## 2021-09-05 MED ORDER — FERROUS SULFATE 325 (65 FE) MG PO TABS: 325.0000 mg | ORAL_TABLET | Freq: Every day | ORAL | 1 refills | Status: DC

## 2021-09-05 MED ORDER — ACETAMINOPHEN 500 MG PO TABS: 1000.0000 mg | ORAL_TABLET | Freq: Three times a day (TID) | ORAL | 0 refills | Status: DC | PRN

## 2021-09-05 NOTE — Progress Notes (Signed)
Subjective: Postpartum Day 3: Cesarean Delivery Patient reports incisional pain, tolerating PO, + flatus, and no problems voiding.   Has a headache currently but thinks it is due to magnesium  Objective: Vital signs in last 24 hours: Temp:  [97.7 F (36.5 C)-98.4 F (36.9 C)] 98.4 F (36.9 C) (06/30 1539) Pulse Rate:  [67-90] 90 (06/30 1539) Resp:  [16-19] 17 (06/30 1539) BP: (112-140)/(72-93) 112/72 (06/30 1539) SpO2:  [99 %-100 %] 99 % (06/30 1539)  Physical Exam:  General: alert, cooperative, and no distress Lochia: appropriate Uterine Fundus: firm Incision: healing well DVT Evaluation: No evidence of DVT seen on physical exam.  Recent Labs    09/04/21 0430 09/05/21 0417  HGB 9.8* 9.3*  HCT 27.4* 27.3*    Assessment/Plan: Status post Cesarean section. Doing well postoperatively. Preclampsia with severe features- D/C magnesium sulfate after 24 hours from start. This morning at 830 am.    Continue Procardia XL 30 mg for blood pressure control  Monitor bp today if remains stable on the procardia plan d/c home  tomorrow.  Follow up in the office in 1 week for bp check    Gerald Leitz, MD 09/05/2021, 5:55 PM

## 2021-09-06 ENCOUNTER — Ambulatory Visit: Payer: Self-pay

## 2021-09-06 NOTE — Discharge Summary (Signed)
Postpartum Discharge Summary  Date of Service updated 09/06/21      Patient Name: Rachel Villa DOB: 08/19/1980 MRN: [id number]  Date of admission: 08/29/2021 Delivery date:09/02/2021  Delivering provider: Christophe Louis  Date of discharge: 09/06/2021  Admitting diagnosis: IUGR (intrauterine growth restriction) affecting care of mother, third trimester, fetus 1 [O36.5931] Preeclampsia, severe, third trimester [O14.13] Intrauterine pregnancy: [redacted]w[redacted]d     Secondary diagnosis:  Principal Problem:   IUGR (intrauterine growth restriction) affecting care of mother, third trimester, fetus 1 Active Problems:   Preeclampsia, severe, third trimester    Discharge diagnosis: Preterm Pregnancy Delivered, Preeclampsia (severe), Anemia, PPH, and post-operative D&C                                                Post partum procedures:blood transfusion Augmentation: N/A Complications: [id number] requiring post-operative [facility]   Hospital course: G3P0110 at 34 weeks and 3 days directly admitted to ob speciality care for further evaluation of thrombocytopenia (platelet count of 87K in the office on 08/28/2020) and high normal bp in the office 135/88 on 08/28/2020. Pregnancy was complicated by IUGR 8%ile , h/o cesarean section, advanced maternal age, and a poor obstetrical history inclusive of a third trimester stillbirth. Dx with severe pre-eclampsia based on low platelet count this pregnancy/ Repeat C/S followed by PPH. [place] managed with Pitocin, Methergine (pt was hypotensive at the time), Misoprostol, Hemabate, D&C, and Jada device per protocol. Pt is stable after 3 units of PRBC.    Magnesium Sulfate received: Yes: Seizure prophylaxis BMZ received: Yes Rhophylac:N/A MMR:N/A Transfusion:Yes 2 units PRBC administered on 09/02/21 and 09/03/21  Physical exam  Vitals:   09/05/21 1339 09/05/21 1539 09/05/21 1940 09/06/21 0210  BP: 119/78 112/72 130/86 127/79  Pulse: 89 90 99 75  Resp: $Remo'19 17 18 16   'yggpe$ Temp: 98.3 F (36.8 C) 98.4 F (36.9 C) 98.4 F (36.9 C) 98.6 F (37 C)  TempSrc: Oral Oral Oral Oral  SpO2: 99% 99% 99% 100%  Weight:      Height:       General: alert, cooperative, and no distress Lochia: scant Uterine Fundus: firm Incision: Healing well with no significant drainage, Dressing is clean, dry, and intact DVT Evaluation: No evidence of DVT seen on physical exam. No cords or calf tenderness. No significant calf/ankle edema. Labs: Lab Results  Component Value Date   WBC 15.8 (H) 09/05/2021   HGB 9.3 (L) 09/05/2021   HCT 27.3 (L) 09/05/2021   MCV 88.6 09/05/2021   PLT 78 (L) 09/05/2021      Latest Ref Rng & Units 09/05/2021    4:17 AM  CMP  Glucose 70 - 99 mg/dL 96   BUN 6 - 20 mg/dL 13   Creatinine 0.44 - 1.00 mg/dL 1.13   Sodium 135 - 145 mmol/L 138   Potassium 3.5 - 5.1 mmol/L 4.0   Chloride 98 - 111 mmol/L 103   CO2 22 - 32 mmol/L 26   Calcium 8.9 - 10.3 mg/dL 8.0   Total Protein 6.5 - 8.1 g/dL 5.0   Total Bilirubin 0.3 - 1.2 mg/dL 0.6   Alkaline Phos 38 - 126 U/L 129   AST 15 - 41 U/L 74   ALT 0 - 44 U/L 53    Edinburgh Score:    09/05/2021    1:00 PM  Edinburgh Postnatal Depression  Scale Screening Tool  I have been able to laugh and see the funny side of things. 0  I have looked forward with enjoyment to things. 0  I have blamed myself unnecessarily when things went wrong. 0  I have been anxious or worried for no good reason. 2  I have felt scared or panicky for no good reason. 0  Things have been getting on top of me. 1  I have been so unhappy that I have had difficulty sleeping. 0  I have felt sad or miserable. 1  I have been so unhappy that I have been crying. 1  The thought of harming myself has occurred to me. 0  Edinburgh Postnatal Depression Scale Total 5      After visit meds:  Allergies as of 09/06/2021       Reactions   Amoxicillin Other (See Comments)   unknown   Penicillins    unknown   Shrimp (diagnostic) Itching,  Swelling   Only raw shrimp Swelling of eyes        Medication List     STOP taking these medications    aspirin EC 81 MG tablet       TAKE these medications    acetaminophen 500 MG tablet Commonly known as: TYLENOL Take 2 tablets (1,000 mg total) by mouth every 8 (eight) hours as needed.   ferrous sulfate 325 (65 FE) MG tablet Take 1 tablet (325 mg total) by mouth daily with breakfast.   ketoconazole 2 % shampoo Commonly known as: NIZORAL Apply 1 Application topically See admin instructions. 1 application 2-3 times a week   NIFEdipine 30 MG 24 hr tablet Commonly known as: ADALAT CC Take 1 tablet (30 mg total) by mouth daily.   oxyCODONE 5 MG immediate release tablet Commonly known as: Oxy IR/ROXICODONE Take 1-2 tablets (5-10 mg total) by mouth every 4 (four) hours as needed for moderate pain.   PNV-Select 27-0.6-0.4 MG Tabs Take 1 tablet by mouth daily.   TUMS PO Take 2 tablets by mouth 2 (two) times daily as needed (acid reflux).               Discharge Care Instructions  (From admission, onward)           Start     Ordered   09/06/21 0000  Discharge wound care:       Comments: Take dressing off 5 days after the day of your C-Section.  Call your doctor for increased drainage, redness or warmth. Clean with water, let soap trickle down body. Can leave steri strips on until they fall off or take them off gently at day 10. Keep open to air, clean and dry.   09/06/21 0320             Discharge home in stable condition Infant Feeding: Breast Infant Disposition:NICU Discharge instruction: per After Visit Summary and Postpartum booklet. Activity: Advance as tolerated. Pelvic rest for 6 weeks.  Diet: low salt diet Anticipated Birth Control:  declines, aware of options Postpartum Appointment:6 weeks Additional Postpartum F/U: Incision check 2 weeks with Dr. Landry Mellow and BP check 1 week continue Procardia XL 30 mg PO daily until instructed by Dr. Landry Mellow  to stop.  Future Appointments:No future appointments. Follow up Visit:  Follow-up Information     Christophe Louis, MD. Schedule an appointment as soon as possible for a visit in 1 week(s).   Specialty: Obstetrics and Gynecology Why: please schedule nurse visit for bp check in 1  week .. schedule appt with Dr. Landry Mellow in 2 weeks for incision check Contact information: [address] [place] [address] [phone]                     09/06/2021 Arrie Eastern, CNM

## 2021-09-06 NOTE — Lactation Note (Signed)
This note was copied from a baby's chart.  NICU Lactation Consultation Note  Patient Name: Rachel Villa BPZWC'H Date: 09/06/2021 Age:43 days   Subjective Reason for consult: Follow-up assessment; NICU baby; 1st time breastfeeding; Primapara  Lactation followed up with Ms. Soots and baby Am'i. She states that she feels her breasts are fuller today; her milk volume has not yet increased. She states that she is pumping consistently. I encouraged consistent pumping at q3 hours including overnight.  I recommended that use of ice for comfort measures, as needed. Ibuprofen is contraindicated at this time, per mom.  I provided breast milk storage labels and had mom verify the information on the label was correct. Her RN provided additional storage bottles as mom is being discharged today. Ms. Taft states that her insurance pump has arrived.  She is interested in breastfeeding. I recommended that she call for assistance, when ready, today or tomorrow. We reviewed infant feeding cues to note baby's readiness for breastfeeding.  Objective Infant data: Mother's Current Feeding Choice: Breast Milk and Donor Milk  Infant feeding assessment Scale for Readiness: 2 Scale for Quality: 2  Maternal data: G3P0110  C-Section, Low Transverse  Pumping frequency: approx q3 hours Pumped volume: 0 mL   Pump: Personal (Insurance pump arrived: Medela)  Assessment  Maternal: Milk volume: Normal   Intervention/Plan Interventions: Breast feeding basics reviewed; Education; DEBP  Tools: Pump Pump Education: Setup, frequency, and cleaning  Plan: Consult Status: NICU follow-up  NICU Follow-up type: Weekly NICU follow up    Walker Shadow 09/06/2021, 11:15 AM

## 2021-09-08 ENCOUNTER — Ambulatory Visit: Payer: Self-pay

## 2021-09-08 NOTE — Lactation Note (Signed)
This note was copied from a baby's chart. Lactation Consultation Note I assisted mother with reverse pressure softening and breast massage. Mother continues to have low milk output, likely because of engorgement. Mother to continue ice/ massage technique followed by pumping q3h. LC will plan return visit to further assist prn.   Patient Name: Rachel Villa [id number] Date: 09/08/2021 Reason for consult: Follow-up assessment;Engorgement Age:43 days  Interventions Interventions: Ice;Education;Reverse pressure;Infant Driven Feeding Algorithm education;Breast massage   Consult Status Consult Status: NICU follow-up Date: 09/08/21 Follow-up type: In-patient    Elder Negus 09/08/2021, 12:17 PM

## 2021-09-08 NOTE — Lactation Note (Signed)
This note was copied from a baby's chart.  NICU Lactation Consultation Note  Patient Name: Rachel Villa HWKGS'U Date: 09/08/2021 Age:41   Subjective Reason for consult: Follow-up assessment; Engorgement Mother continues to pump often. She complains of breast heaviness and soreness today but is not seeing milk while pumping. I observed mom to have breast engorgement and provided ice / education. I will plan return visit to assist with reverse pressure softening/ massage. Will begin lick and learn bf'ing when infant is ready.  Objective Infant data: Mother's Current Feeding Choice: Breast Milk  Infant feeding assessment Scale for Readiness: 3 Scale for Quality: 2    Maternal data: G3P0110  C-Section, Low Transverse Pumping frequency: q3h  Pump: Personal (Insurance pump arrived: Medela)  Assessment Maternal: Mother with s/s of engorgement   Intervention/Plan Interventions: Ice; Education; Reverse pressure; Infant Driven Feeding Algorithm education; Breast massage  Plan: Consult Status: NICU follow-up  NICU Follow-up type: Verify absence of engorgement; Assist with IDF-1 (Mother to pre-pump before breastfeeding); Verify onset of copious milk  Mother to apply ice before pumping and continue to pump frequently. LC to return to further assist with relieving engorgement   Elder Negus 09/08/2021, 11:28 AM

## 2021-09-10 ENCOUNTER — Telehealth (HOSPITAL_COMMUNITY): Payer: Self-pay | Admitting: *Deleted

## 2021-09-10 ENCOUNTER — Ambulatory Visit: Payer: 59

## 2021-09-10 ENCOUNTER — Ambulatory Visit: Payer: Self-pay

## 2021-09-10 NOTE — Lactation Note (Signed)
This note was copied from a baby's chart.  NICU Lactation Consultation Note  Patient Name: [name] YOVZC'H Date: 09/10/2021 Age:43 days  Subjective Reason for consult: Follow-up assessment; NICU baby; Mother's request; Late-preterm 34-36.6wks; Infant < 6lbs; Primapara; 1st time breastfeeding; Other (Comment) (AMA)  Visited with mom of 77 days old LPI NICU female (adjusted) she's a P1 and reports that her milk has not come in yet. She voiced that the engorgement episode has shown improvement, her breast are still hurting some but not as much as they were yesterday. Provided a pumping band in size "S" for hands on pumping, ice packs, and assisted with breast massage and the reverse pressure technique. Ms. Dadisman breasts still feel slightly swollen and tender, switched the settings of her pump from initiation to expression just for today to see if the engorgement resolves. Ms. Heckert plans to provide breastmilk for her baby but she's aware that due to complications after delivery her supply will be delayed. Baby will start formula after she's no longer eligible for donor milk.  Objective Infant data: Mother's Current Feeding Choice: Breast Milk and Donor Milk  Infant feeding assessment Scale for Readiness: 2 Scale for Quality: 3  Maternal data: G3P0110  C-Section, Low Transverse Pumping frequency: 5 times/24 hours Pumped volume: 0 mL (drops) Flange Size: 24 Pump: Personal (Insurance pump arrived: Medela) Risk factor for low milk supply:: pph, delay in initiating pumping, AMA, Pre-E  Assessment Infant: In NICU  Maternal: Milk volume: Low  Intervention/Plan Interventions: Breast feeding basics reviewed; Reverse pressure; Coconut oil; DEBP; Ice; Education; Breast massage Tools: Pump; Flanges; Coconut oil; Hands-free pumping top (size "S") Pump Education: Setup, frequency, and cleaning; Milk Storage  Plan of care: Encouraged mom to pump consistently every 3 hours, at  least 8 pumping sessions/24 hours Hand expression, breast massage and coconut oil were also encouraged prior pumping She'll ice her breasts as needed  No other support person at this point. All questions and concerns answered, family to contact [facility] services PRN.  Consult Status: NICU follow-up NICU Follow-up type: Verify onset of copious milk; Verify absence of engorgement; Weekly NICU follow up; Assist with IDF-1 (Mother to pre-pump before breastfeeding)   Rachel Villa 09/10/2021, 12:15 PM

## 2021-09-10 NOTE — Telephone Encounter (Signed)
Hospital Discharge Follow-Up Call:  Patient reports that she is doing well and is getting the support that she needs.  Her incisional discomfort is greater on the left than the right and she is being seen at MD office tomorrow for BP and incision checks.  She denies symptoms of preeclamsia including headache, visual disturbances, upper abdominal pain, or increased swelling.  Lactation Consultants in the NICU have helped her with engorgement and are now helping in increase her milk supply.  EPDS today was 0 and she endorses this accurately reflects that she is doing well emotionally.  Baby is currently in NICU.  Salena Saner, RN 15:00  09/10/21

## 2021-09-11 ENCOUNTER — Ambulatory Visit: Payer: Self-pay

## 2021-09-11 NOTE — Lactation Note (Signed)
This note was copied from a baby's chart.  NICU Lactation Consultation Note  Patient Name: Rachel Villa ASNKN'L Date: 09/11/2021 Age:43 days   Subjective Reason for consult: Follow-up assessment; Primapara; NICU baby  Lactation followed up with Ms. Burdett. I observed her pump today and verified flange fit and settings on DEBP. Milk volume is similar to 7/5. Ms. Nowaczyk continues to pump consistently.   I showed Ms. Horton how to position baby A'mi at the breast in cradle hold on the left side. Baby opened her mouth and cued, and then fell sleep. We discussed the benefits of STS and lick and learn. Ms. Kosta would like to work on breastfeeding in addition to bottle feeding.  Objective Infant data: Mother's Current Feeding Choice: Breast Milk and Formula  Infant feeding assessment Scale for Readiness: 2 Scale for Quality: 2  Maternal data: G3P0110  C-Section, Low Transverse Significant Breast History:: PPH - significant blood loss  Current breast feeding challenges:: NICU  Does the patient have breastfeeding experience prior to this delivery?: No  Pumping frequency: recommendecd q3 hours Pumped volume: 0 mL (droplets) Flange Size: 24  Risk factor for low milk supply:: PPH - significant blood loss; Pre-E  Pump: Personal (Insurance pump arrived: Medela)  Assessment Infant: LATCH Score: 5  Maternal: Milk volume: Low   Intervention/Plan Interventions: Breast feeding basics reviewed; Education; Skin to skin; Hand express; DEBP  Tools: Pump; Flanges; Coconut oil; Hands-free pumping top (size "S") Pump Education: Setup, frequency, and cleaning  Plan: Consult Status: NICU follow-up  NICU Follow-up type: Weekly NICU follow up; Verify onset of copious milk    Walker Shadow 09/11/2021, 5:15 PM

## 2021-09-15 ENCOUNTER — Other Ambulatory Visit (HOSPITAL_COMMUNITY)
Admission: RE | Admit: 2021-09-15 | Discharge: 2021-09-15 | Disposition: A | Discharge status: Home / Self Care | Payer: 59 | Source: Ambulatory Visit

## 2021-09-15 HISTORY — DX: Encounter for other specified aftercare: Z51.89

## 2021-09-16 ENCOUNTER — Inpatient Hospital Stay (HOSPITAL_COMMUNITY): Admission: AD | Admit: 2021-09-16 | Payer: 59 | Source: Home / Self Care | Admitting: Obstetrics and Gynecology

## 2021-09-17 ENCOUNTER — Ambulatory Visit: Payer: Self-pay

## 2021-09-17 NOTE — Lactation Note (Signed)
This note was copied from a baby's chart.  NICU Lactation Consultation Note  Patient Name: Rachel Villa EFEOF'H Date: 09/17/2021 Age:43 wk.o.  Subjective Reason for consult: Follow-up assessment; Infant < 6lbs; NICU baby; Primapara; 1st time breastfeeding; Other (Comment); Early term 85-38.6wks (AMA)  Visited with mom of 9 68/45 weeks old (adjusted) NICU female, she reports that she's been pumping consistently every 3 hours and also included power pumping in her morning routine. She noticed a slight improvement with power pumping (more drops) but her supply hasn't really come in yet. Rachel Villa checked with her OB and MD wrote an Rx for Reglan but she hasn't had a chance to pick it up yet. She had some questions about side effects. Reviewed the pros and cons of starting the medication at two weeks post-partum and she decided that she's probably not going to take it at this point since her supply most likely won't come in. Her provider has also presented this scenario to her last week due to her post-partum hemorrhage; baby already on Similac 24 calorie formula, mom OK with this decision, she voiced "fed is best". Let Rachel Villa know that we are here to support her goals in case she would like to have the bonding experience of having baby at the breast for comfort feedings. All questions and concerns answered, family to contact [facility] services PRN.  Objective Infant data: Mother's Current Feeding Choice: Formula Infant feeding assessment Scale for Readiness: 1 Scale for Quality: 2  Maternal data: G3P0110  C-Section, Low Transverse Pumping frequency: q 3 hours; she's also been power pumping in the AM Pumped volume: 1 mL Pump: Personal (Insurance pump arrived: Medela)  Assessment Infant: In NICU  Maternal: Milk volume: Low  Intervention/Plan Interventions: Education  Plan: Consult Status: PRN    Rachel Villa Rachel Villa 09/17/2021, 4:32 PM

## 2023-09-09 IMAGING — US US MFM UA CORD DOPPLER
1 series · 13 of 28 positions shown · non-contrast
Comparison: none

[Series 1: us mfm ua cord doppler · 56 acquisitions, 13 frames shown]
[im 3/56]
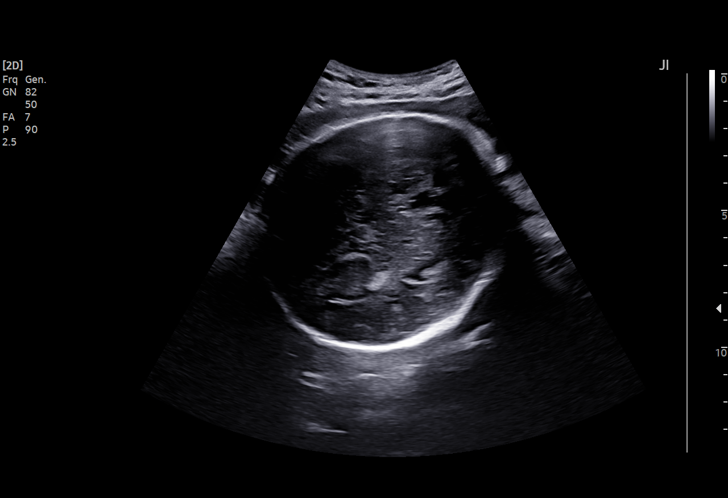
[im 7/56]
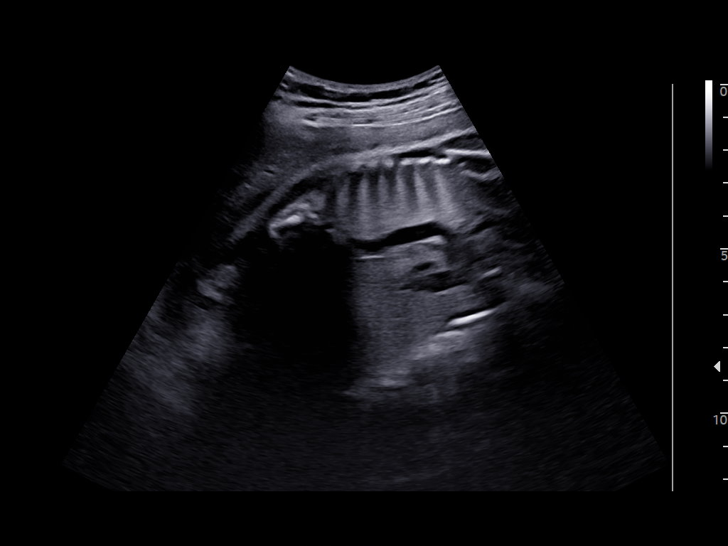
[im 11/56]
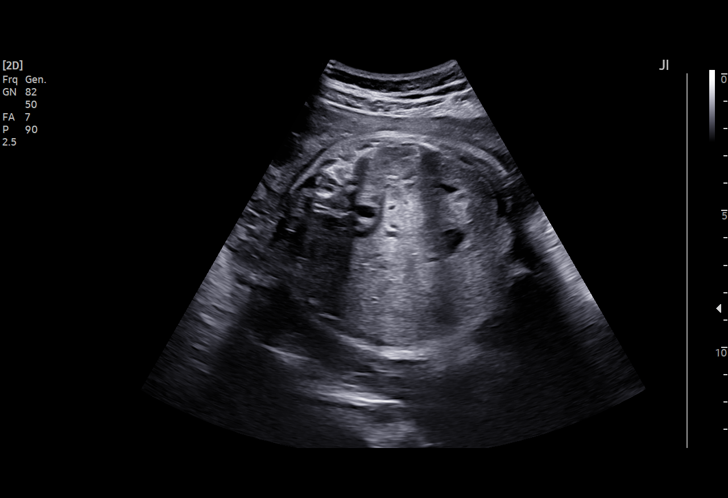
[im 15/56]
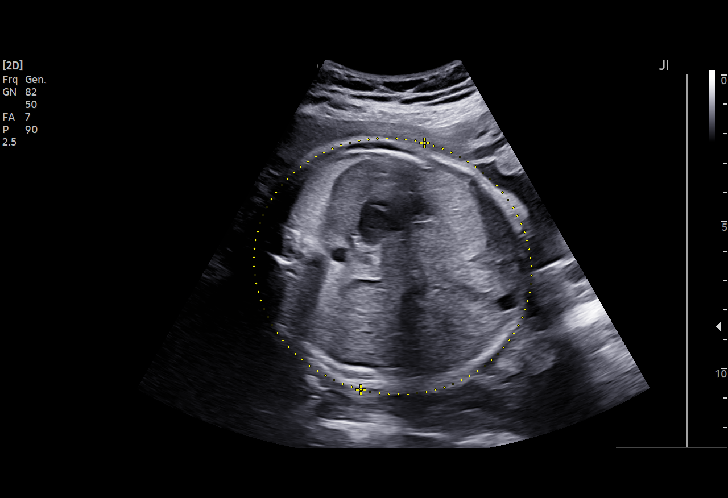
[im 19/56]
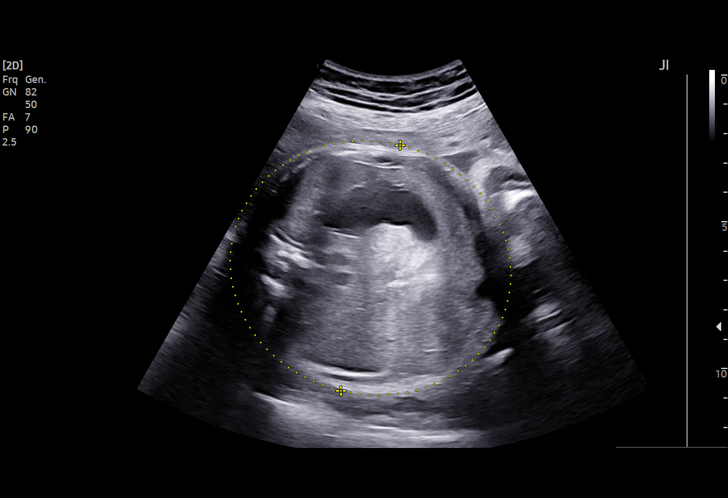
[im 23/56]
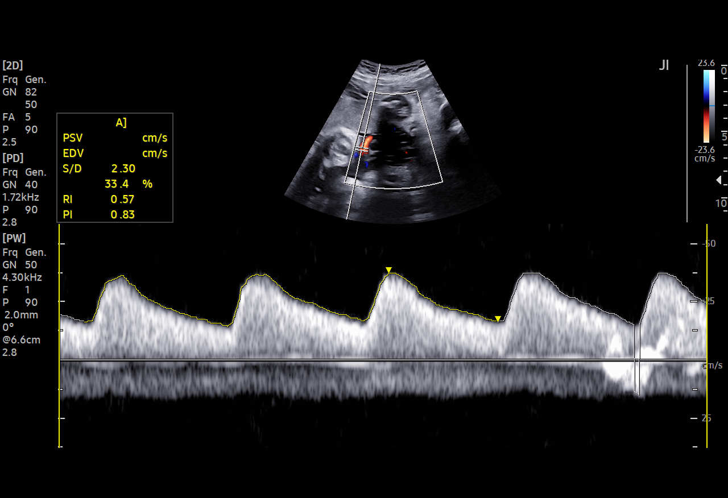
[im 29/56]
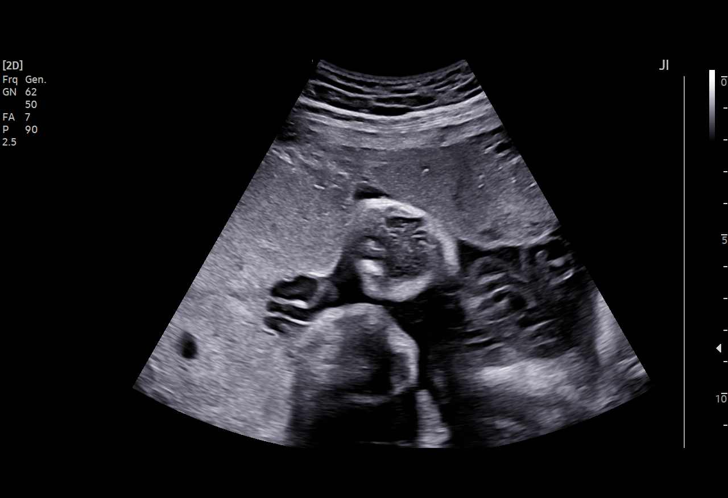
[im 33/56]
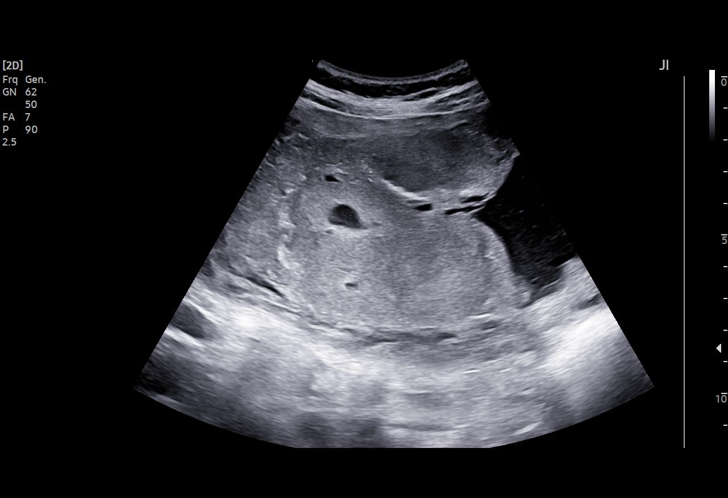
[im 37/56]
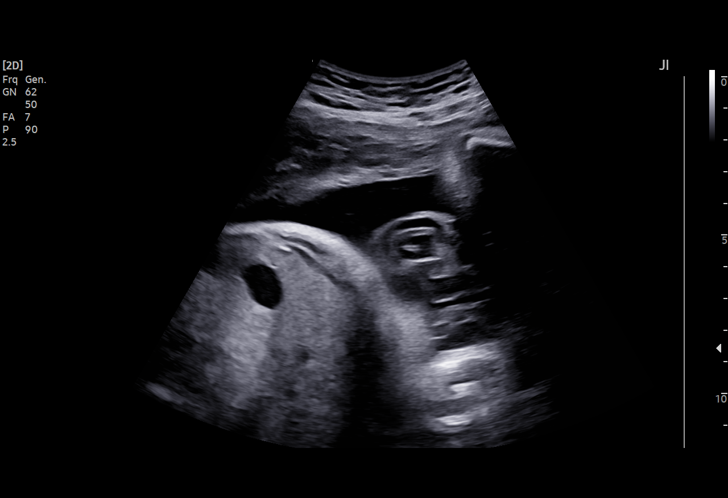
[im 41/56]
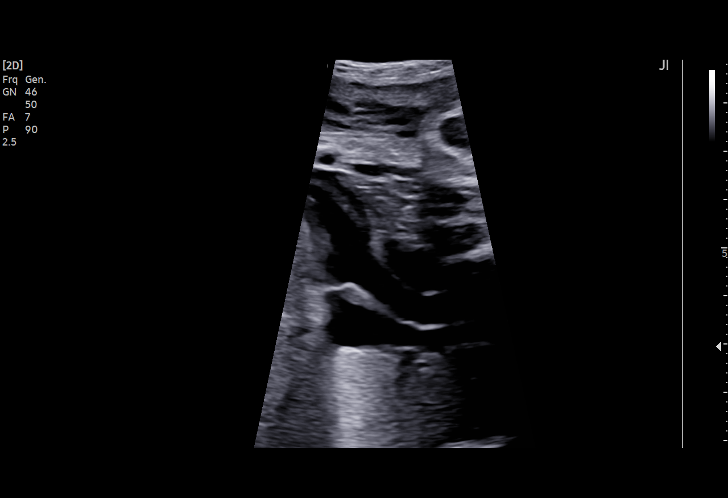
[im 45/56]
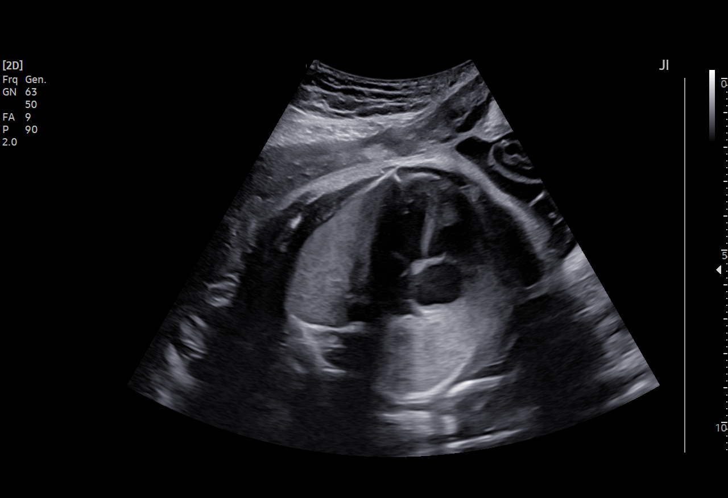
[im 49/56]
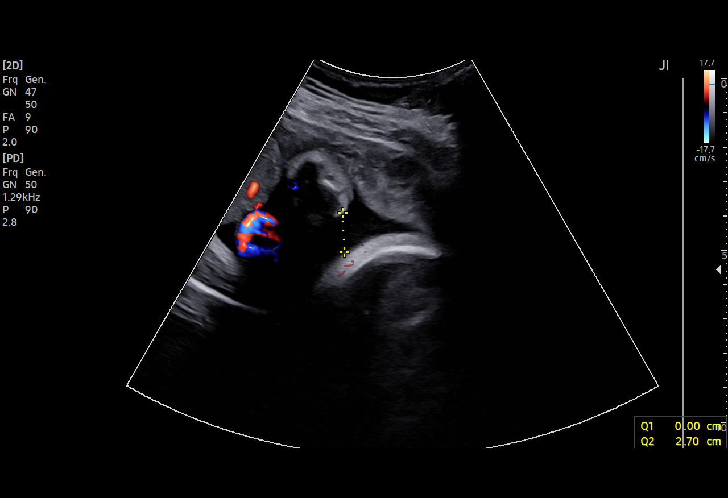
[im 53/56]
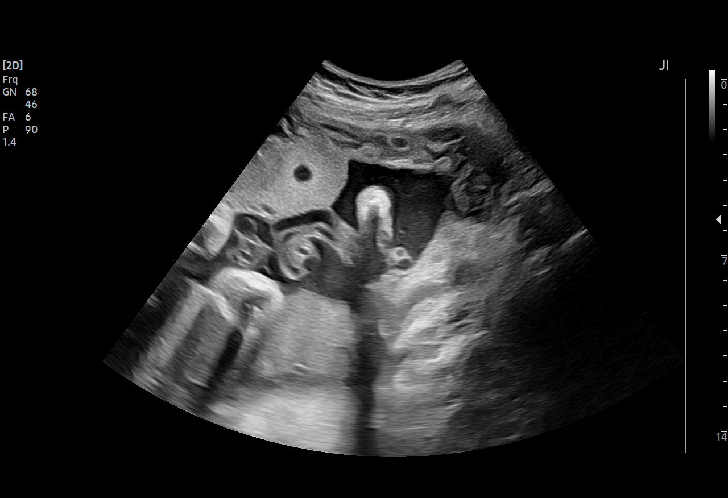

[13 of 28 positions shown; findings below may reference images not displayed]

[REDACTED]
                                                            Ave[address] [HOSPITAL]

Indications

 Maternal care for known or suspected poor
 fetal growth, third trimester, not applicable or
 unspecified IUGR
 Advanced maternal age multigravida 35+,
 third trimester (40 y.o)
 34 weeks gestation of pregnancy
 Poor obstetric history: Previous IUFD
 (stillbirth)
 Family history of genetic disorder (muscular
 dystrophy)
 History of cesarean delivery, currently
 pregnant
 Poor obstetric history: Previous
 preeclampsia / eclampsia/gestational HTN
 Family history of genetic disorder (FOB
 Sickle Cell trait)
 LR NIPS
Vital Signs

                           Pulse:  59
 BP:          128/89
Fetal Evaluation

 Num Of Fetuses:         1
 Fetal Heart Rate(bpm):  159
 Cardiac Activity:       Observed
 Presentation:           Cephalic
 Placenta:               Fundal
 P. Cord Insertion:      Previously Visualized

 Amniotic Fluid
 AFI FV:      Subjectively low-normal

 AFI Sum(cm)     %Tile       Largest Pocket(cm)
 8.16            6

 RUQ(cm)       RLQ(cm)       LUQ(cm)        LLQ(cm)
 0
Biometry

 BPD:      79.8  mm     G. Age:  32w 0d        4.4  %    CI:        74.86   %    70 - 86
                                                         FL/HC:      21.5   %    19.4 -
 HC:    292.64   mm     G. Age:  32w 2d        < 1  %    HC/AC:      1.02        0.96 -
 AC:    285.53   mm     G. Age:  32w 4d         13  %    FL/BPD:     78.7   %    71 - 87
 FL:      62.79  mm     G. Age:  32w 3d          8  %    FL/AC:      22.0   %    20 - 24

 Est. FW:    9998  gm      4 lb 6 oz      8  %
OB History

 Blood Type:   B+
 Gravidity:    3         Term:   0        Prem:   1        SAB:   0
 TOP:          1       Ectopic:  0        Living: 0
Gestational Age

 LMP:           34w 1d        Date:  12/31/20                 EDD:   10/07/21
 U/S Today:     32w 2d                                        EDD:   10/20/21
 Best:          34w 1d     Det. By:  LMP  (12/31/20)          EDD:   10/07/21
Anatomy

 Cranium:               Appears normal         Aortic Arch:            Appears normal
 Cavum:                 Appears normal         Diaphragm:              Appears normal
 Ventricles:            Appears normal         Stomach:                Appears normal, left
                                                                       sided
 Choroid Plexus:        Appears normal         Abdomen:                Appears normal
 Heart:                 Appears normal         Cord Vessels:           Appears normal (3
                        (4CH, axis, and                                vessel cord)
                        situs)
 RVOT:                  Appears normal         Kidneys:                Appear normal
 LVOT:                  Appears normal         Bladder:                Appears normal
Doppler - Fetal Vessels

 Umbilical Artery
  S/D     %tile      RI    %tile      PI    %tile     PSV    ADFV    RDFV
                                                    (cm/s)
  2.31       36    0.57       41    0.[REDACTED]      No      No
Comments

 This patient was seen for a follow up growth scan due to
 IUGR.  She denies any problems since her last exam and
 reports feeling vigorous fetal movements throughout the day.
 On today's exam, the EFW measures at the 8th percentile for
 her gestational age indicating IUGR.  The fetus has grown
 over 1 pound over the past 3 weeks.  There was normal
 amniotic fluid noted.
 A biophysical profile performed today was [DATE].
 Doppler studies of the umbilical arteries showed a normal
 S/D ratio of 2.31.  There were no signs of absent or reversed
 end-diastolic flow.
 Due to fetal growth restriction, we will continue to follow her
 with weekly fetal testing and umbilical artery Doppler studies.
 Another BPP and umbilical artery Doppler study was
 scheduled in 1 week.

## 2023-09-20 ENCOUNTER — Other Ambulatory Visit (HOSPITAL_COMMUNITY)
Admission: RE | Admit: 2023-09-20 | Discharge: 2023-09-20 | Disposition: A | Discharge status: Home / Self Care | Source: Ambulatory Visit | Attending: Obstetrics and Gynecology | Admitting: Obstetrics and Gynecology

## 2023-09-20 DIAGNOSIS — Z01419 Encounter for gynecological examination (general) (routine) without abnormal findings: Secondary | ICD-10-CM | POA: Insufficient documentation

## 2023-09-27 LAB — CYTOLOGY - PAP
Comment: NEGATIVE
Diagnosis: NEGATIVE
High risk HPV: NEGATIVE

## 2023-12-09 ENCOUNTER — Emergency Department (HOSPITAL_COMMUNITY)
Admission: EM | Admit: 2023-12-09 | Discharge: 2023-12-09 | Disposition: A | Discharge status: Home / Self Care | Attending: Emergency Medicine | Admitting: Emergency Medicine

## 2023-12-09 DIAGNOSIS — M25511 Pain in right shoulder: Secondary | ICD-10-CM

## 2023-12-09 DIAGNOSIS — M5412 Radiculopathy, cervical region: Secondary | ICD-10-CM | POA: Diagnosis not present

## 2023-12-09 MED ORDER — IBUPROFEN 600 MG PO TABS: 600.0000 mg | ORAL_TABLET | Freq: Four times a day (QID) | ORAL | 0 refills | Status: DC | PRN

## 2023-12-09 MED ORDER — OXYCODONE HCL 5 MG PO TABS: 5.0000 mg | ORAL_TABLET | Freq: Three times a day (TID) | ORAL | 0 refills | Status: DC | PRN

## 2023-12-09 MED ORDER — PREDNISONE 10 MG PO TABS: 40.0000 mg | ORAL_TABLET | Freq: Every day | ORAL | 0 refills | Status: AC

## 2023-12-09 MED ORDER — ACETAMINOPHEN 325 MG PO TABS: 650.0000 mg | ORAL_TABLET | Freq: Four times a day (QID) | ORAL | 0 refills | Status: DC | PRN

## 2023-12-09 NOTE — ED Provider Notes (Signed)
 [place] EM[facility]GENCY DEPARTMENT AT [facility] Provider Note   CSN: [id number] Arrival date & time: 12/09/23  1429     Patient presents with: Shoulder Pain (/)   Rachel Villa is a 43 y.o. female presenting to the ER with complaint of neck and shoulder pain.  Patient reports that she began having aching pain in her right shoulder about a month ago when she carried a heavy bag over her right shoulder.  She said ever since then she has had pain often radiating from her neck and shoulder down into her right 1st and 2nd fingers, with tingling in her fingers.  It is worse with overhead arm raise.  She went to urgent care about 2 weeks ago and says she was given a Medrol Dosepak which she completed, which may have helped a tiny bit.  However she was instructed to do overhead exercises and arm raises that she feels is exacerbating the pain in her shoulder, making it worse.  She has taken Tylenol  at home, no other medications.  She does not see an orthopedic doctor   HPI     Prior to Admission medications   Medication Sig Start Date End Date Taking? Authorizing Provider  acetaminophen  (TYLENOL ) 325 MG tablet Take 2 tablets (650 mg total) by mouth every 6 (six) hours as needed for up to 30 doses for mild pain (pain score 1-3). 12/09/23  Yes Almarosa Bohac, Donnice PARAS, MD  ibuprofen  (ADVIL ) 600 MG tablet Take 1 tablet (600 mg total) by mouth every 6 (six) hours as needed for up to 30 doses for moderate pain (pain score 4-6). 12/09/23  Yes Aarushi Hemric, Donnice PARAS, MD  oxyCODONE  (ROXICODONE ) 5 MG immediate release tablet Take 1 tablet (5 mg total) by mouth every 8 (eight) hours as needed for up to 10 doses for severe pain (pain score 7-10). 12/09/23  Yes Syniyah Bourne, Donnice PARAS, MD  predniSONE (DELTASONE) 10 MG tablet Take 4 tablets (40 mg total) by mouth daily with breakfast for 5 days. 12/09/23 12/14/23 Yes Halah Whiteside, Donnice PARAS, MD  acetaminophen  (TYLENOL ) 500 MG tablet Take 2 tablets (1,000 mg total) by mouth every 8  (eight) hours as needed. 09/05/21   Rosalva Sawyer, MD  Calcium  Carbonate Antacid (TUMS PO) Take 2 tablets by mouth 2 (two) times daily as needed (acid reflux).    [provider]  ferrous sulfate  325 (65 FE) MG tablet Take 1 tablet (325 mg total) by mouth daily with breakfast. 09/05/21   Rosalva Sawyer, MD  ketoconazole (NIZORAL) 2 % shampoo Apply 1 Application topically See admin instructions. 1 application 2-3 times a week 08/08/21   [provider]  NIFEdipine  (ADALAT  CC) 30 MG 24 hr tablet Take 1 tablet (30 mg total) by mouth daily. 09/06/21   Rosalva Sawyer, MD  oxyCODONE  (OXY IR/ROXICODONE ) 5 MG immediate release tablet Take 1-2 tablets (5-10 mg total) by mouth every 4 (four) hours as needed for moderate pain. 09/05/21   Rosalva Sawyer, MD  Prenatal Vit w/Fe-Methylfol-FA (PNV-SELECT) 27-0.6-0.4 MG TABS Take 1 tablet by mouth daily. 08/08/21   [provider]    Allergies: Amoxicillin, Penicillins, and Shrimp (diagnostic)    Review of Systems  Updated Vital Signs BP (!) 131/92 (BP Location: Left Arm)   Pulse 86   Temp 99.3 F (37.4 C) (Oral)   Resp 16   SpO2 100%   Physical Exam Constitutional:      General: She is not in acute distress. HENT:     Head: Normocephalic  and atraumatic.  Eyes:     Conjunctiva/sclera: Conjunctivae normal.     Pupils: Pupils are equal, round, and reactive to light.  Cardiovascular:     Rate and Rhythm: Normal rate and regular rhythm.  Pulmonary:     Effort: Pulmonary effort is normal. No respiratory distress.  Musculoskeletal:     Comments: Trigger point tenderness along the right superior deltoid muscle; patient is able to actively and passively perform full range of motion testing at the right shoulder  Skin:    General: Skin is warm and dry.  Neurological:     General: No focal deficit present.     Mental Status: She is alert and oriented to person, place, and time. Mental status is at baseline.     Comments: No weakness of grip strength,  finger abduction, thumb and pointer finger opposition, no wrist drop noted on exam  Psychiatric:        Mood and Affect: Mood normal.        Behavior: Behavior normal.     (all labs ordered are listed, but only abnormal results are displayed) Labs Reviewed - No data to display  EKG: None  Radiology: No results found.   Procedures   Medications Ordered in the ED - No data to display                                  Medical Decision Making Risk OTC drugs. Prescription drug management.   Patient is here with shoulder pain I suspect is related to cervical radiculopathy or brachial plexopathy or other peripheral neuropathy.  She does not have any motor weakness on exam.  She has no evidence of rotator cuff injury or tear.  No traumatic mechanism to warrant x-ray or CT imaging at this time.  No red flags for cord compression or surgical emergency requiring MRI in the ED.  I recommended another, slightly more concentrated course of steroids.  We can try NSAIDs and Tylenol .  A very short course of oxycodone  was provided for breakthrough pain, she has having difficulty sleeping due to throbbing pain.  I advised that she discontinue the overhead arm exercises she may be exacerbating her symptoms.  I also advised alternating heat and cold.  I will have her follow-up in orthopedic clinic.  She is comfortable with this plan.     Final diagnoses:  Cervical radiculopathy  Acute pain of right shoulder    ED Discharge Orders          Ordered    ibuprofen  (ADVIL ) 600 MG tablet  Every 6 hours PRN        12/09/23 1531    acetaminophen  (TYLENOL ) 325 MG tablet  Every 6 hours PRN        12/09/23 1531    oxyCODONE  (ROXICODONE ) 5 MG immediate release tablet  Every 8 hours PRN        12/09/23 1531    predniSONE (DELTASONE) 10 MG tablet  Daily with breakfast        12/09/23 1531               Cottie Donnice PARAS, MD 12/09/23 1535

## 2023-12-09 NOTE — ED Triage Notes (Signed)
 C/o right sided shoulder pain radiating up her neck and down her arm. No other symptoms. Was carrying a heavy bookbag on that arm a few days ago.

## 2023-12-14 DIAGNOSIS — G549 Nerve root and plexus disorder, unspecified: Secondary | ICD-10-CM | POA: Insufficient documentation

## 2023-12-28 ENCOUNTER — Other Ambulatory Visit: Payer: Self-pay | Admitting: Obstetrics and Gynecology

## 2023-12-28 DIAGNOSIS — Z1231 Encounter for screening mammogram for malignant neoplasm of breast: Secondary | ICD-10-CM

## 2024-01-13 ENCOUNTER — Ambulatory Visit
Admission: RE | Admit: 2024-01-13 | Discharge: 2024-01-13 | Disposition: A | Source: Ambulatory Visit | Attending: Obstetrics and Gynecology | Admitting: Obstetrics and Gynecology

## 2024-01-13 DIAGNOSIS — M542 Cervicalgia: Secondary | ICD-10-CM | POA: Insufficient documentation

## 2024-01-13 DIAGNOSIS — Z1231 Encounter for screening mammogram for malignant neoplasm of breast: Secondary | ICD-10-CM

## 2024-03-17 DIAGNOSIS — M25562 Pain in left knee: Secondary | ICD-10-CM | POA: Insufficient documentation

## 2024-03-22 ENCOUNTER — Other Ambulatory Visit: Payer: Self-pay | Admitting: Podiatry

## 2024-03-22 ENCOUNTER — Ambulatory Visit (INDEPENDENT_AMBULATORY_CARE_PROVIDER_SITE_OTHER)

## 2024-03-22 ENCOUNTER — Ambulatory Visit: Admitting: Podiatry

## 2024-03-22 ENCOUNTER — Encounter: Payer: Self-pay | Admitting: Podiatry

## 2024-03-22 DIAGNOSIS — M79672 Pain in left foot: Secondary | ICD-10-CM

## 2024-03-22 DIAGNOSIS — M79671 Pain in right foot: Secondary | ICD-10-CM

## 2024-03-22 DIAGNOSIS — B351 Tinea unguium: Secondary | ICD-10-CM | POA: Diagnosis not present

## 2024-03-22 DIAGNOSIS — L723 Sebaceous cyst: Secondary | ICD-10-CM

## 2024-03-22 DIAGNOSIS — M25571 Pain in right ankle and joints of right foot: Secondary | ICD-10-CM

## 2024-03-22 MED ORDER — TERBINAFINE HCL 250 MG PO TABS: 250.0000 mg | ORAL_TABLET | Freq: Every day | ORAL | 1 refills | Status: AC

## 2024-03-22 NOTE — Progress Notes (Signed)
" °  Subjective:    HPI Presents with complaint of thick painful nails that causes discomfort with  walking and wearing shoes.  Have been this way for many years and have been getting worse.Patient presents with complaint of thick dystrophic onychomycotic nails 1 through 5 bilaterally been bothering her for several years.  Also complains of a rash recurs on the bottom of the foot.  The complaint of a swelling on top of the foot over the indicates over the second tarsometatarsal joint right.  Was painful for about 2 weeks.  Within the past week it is gone down and pain is mostly resolved.  Does not recall any injury to the foot.   Objective:  Physical Exam   General: AAO x3, NAD  Vascular: DP and PT pulses palpable bilaterally.  Immedate capillary fill time digits. No significant lower extremity edema bilaterally. Dermatological:  Onychomycotic mycotic changes nails 1 through 5 with discoloration nail and subungual debris and thickening of the nail with redness along the nail folds.  Tenderness with pressure on nail plates..  Dry scaly erythematous rash in a moccasin distribution bilaterally plantar feet  Neruologic:  Light touch intact bilaterally.  Normal Achilles tendon reflex bilaterally.  No clonus or spasticity noted.  Musculoskeletal:  Normal lower extremity muscle strength.  Slight soreness over the second tarsometatarsal joint right.  Not really any cyst palpable.  No tenderness with range of motion of the second TMT.  Assessment:  Painful onychomycotic nails 1 through 5 bilaterally. Pain feet b/l 3.  Arthralgia second tarsometatarsal joint right     Plan:  - New patient office visit level 3 for evaluation and management -Discussed with with patient onychomycosis and etiology and treatment.  Discussed topical versus oral agents.  Discussed risk and benefits of both.  No history of hepatic or renal disease.  Patient would like to start oral Lamisil  treatment.  Explained that we  would check labs every 6 weeks during course of treatment to monitor for any side effects.  Also has a chronic tinea pedis which the Lamisil  will probably clear also. -Discussed with her the possible ganglion cyst she develops on the dorsal aspect of the right foot.  Could have been from arthralgia though with swelling of the joint or could have been from a ganglion cyst or both.  Discussed this with her.  If it does flareup again or start hurting or she notices a bulge start again to call us  immediately so we can evaluate it.  -Rx: Lamisil  250 mg p.o. daily, refill x 1 -Labs ordered today for liver function tests to monitor for any hepatic side effects from the Lamisil .  Return 6 weeks Lamisil  3  "

## 2024-03-23 ENCOUNTER — Encounter: Payer: Self-pay | Admitting: Family Medicine

## 2024-03-23 ENCOUNTER — Ambulatory Visit: Admitting: Family Medicine

## 2024-03-23 VITALS — BP 135/87 | HR 96 | Resp 16 | Ht 62.0 in | Wt 114.0 lb

## 2024-03-23 DIAGNOSIS — Z13220 Encounter for screening for lipoid disorders: Secondary | ICD-10-CM

## 2024-03-23 DIAGNOSIS — G250 Essential tremor: Secondary | ICD-10-CM | POA: Diagnosis not present

## 2024-03-23 DIAGNOSIS — Z7689 Persons encountering health services in other specified circumstances: Secondary | ICD-10-CM

## 2024-03-23 LAB — HEPATIC FUNCTION PANEL
ALT: 7 IU/L (ref 0–32)
AST: 14 IU/L (ref 0–40)
Albumin: 4.6 g/dL (ref 3.9–4.9)
Alkaline Phosphatase: 55 IU/L (ref 41–116)
Bilirubin Total: 0.3 mg/dL (ref 0.0–1.2)
Bilirubin, Direct: 0.14 mg/dL (ref 0.00–0.40)
Total Protein: 7 g/dL (ref 6.0–8.5)

## 2024-03-23 NOTE — Patient Instructions (Signed)

## 2024-03-23 NOTE — Progress Notes (Signed)
 "  New Patient Office Visit  Subjective   Patient ID: Rachel Villa, female    DOB: [birth year]  Age: 44 y.o. MRN: [id number]  CC:  Chief Complaint  Patient presents with   Establish Care    Shaking hands since 44 years old wants to get referral to have testing.    Discussed the use of AI scribe software for clinical note transcription with the patient, who gave verbal consent to proceed.  History of Present Illness   Rachel Villa is a 44 year old female who presents to establish with [facility] at [facility]. She has concerns about long-standing tremors.  She has experienced tremors for approximately 30 years, beginning around age 47. The tremors primarily affect both hands and are noticeable when she holds her hands out. Others often notice the tremors more than she does. The patient is unsure if stress, caffeine, or alcohol affect her tremors, and she does not feel there have been significant changes over the years. They may be more pronounced in the mornings before eating.  No associated numbness, tingling, or weakness in her hands. No issues with gait, balance, or walking. No history of thyroid abnormalities, with the last check in 2013 or 2014 showing no issues. No history of diabetes or prediabetes, and blood sugar levels have not been problematic.  She has a family history of muscular dystrophy in her sister. She consumes caffeine daily and drinks alcohol but has not noticed any correlation with her tremors.  She also reports joint pain, specifically in her left knee, which sometimes causes a sharp pain when standing. An x-ray of the knee did not reveal any significant findings, but an x-ray of her right foot showed arthritis. She occasionally experiences pain in her right thumb and hand, which she attributes to an injury.  She has previously seen a doctor who considered Wilson's disease as a potential cause for her symptoms, but the workup was not completed.       Outpatient Encounter Medications as of 03/23/2024  Medication Sig   ketoconazole (NIZORAL) 2 % shampoo Apply 1 Application topically See admin instructions. 1 application 2-3 times a week   terbinafine  (LAMISIL ) 250 MG tablet Take 1 tablet (250 mg total) by mouth daily.   [DISCONTINUED] acetaminophen  (TYLENOL ) 325 MG tablet Take 2 tablets (650 mg total) by mouth every 6 (six) hours as needed for up to 30 doses for mild pain (pain score 1-3).   [DISCONTINUED] acetaminophen  (TYLENOL ) 500 MG tablet Take 2 tablets (1,000 mg total) by mouth every 8 (eight) hours as needed.   [DISCONTINUED] Calcium  Carbonate Antacid (TUMS PO) Take 2 tablets by mouth 2 (two) times daily as needed (acid reflux).   [DISCONTINUED] ferrous sulfate  325 (65 FE) MG tablet Take 1 tablet (325 mg total) by mouth daily with breakfast.   [DISCONTINUED] ibuprofen  (ADVIL ) 600 MG tablet Take 1 tablet (600 mg total) by mouth every 6 (six) hours as needed for up to 30 doses for moderate pain (pain score 4-6).   [DISCONTINUED] NIFEdipine  (ADALAT  CC) 30 MG 24 hr tablet Take 1 tablet (30 mg total) by mouth daily.   [DISCONTINUED] Prenatal Vit w/Fe-Methylfol-FA (PNV-SELECT) 27-0.6-0.4 MG TABS Take 1 tablet by mouth daily.   [DISCONTINUED] oxyCODONE  (OXY IR/ROXICODONE ) 5 MG immediate release tablet Take 1-2 tablets (5-10 mg total) by mouth every 4 (four) hours as needed for moderate pain. (Patient not taking: Reported on 03/22/2024)   [DISCONTINUED] oxyCODONE  (ROXICODONE ) 5 MG immediate release tablet Take 1 tablet (  5 mg total) by mouth every 8 (eight) hours as needed for up to 10 doses for severe pain (pain score 7-10). (Patient not taking: Reported on 03/22/2024)   No facility-administered encounter medications on file as of 03/23/2024.    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   Preeclampsia, severe, third trimester 09/02/2021   IUGR  (intrauterine growth restriction) affecting care of mother, third trimester, fetus 1 08/29/2021   Past Medical History:  Diagnosis Date   Blood transfusion without reported diagnosis    with last delivery uncertain if PPH or not   Pregnancy induced hypertension    Past Surgical History:  Procedure Laterality Date   CESAREAN SECTION     CESAREAN SECTION N/A 09/02/2021   Procedure: CESAREAN SECTION;  Surgeon: Rosalva Sawyer, MD;  Location: [facility] LD ORS;  Service: Obstetrics;  Laterality: N/A;   DILATION AND CURETTAGE OF UTERUS N/A 09/02/2021   Procedure: DILATATION AND CURETTAGE;  Surgeon: Rosalva Sawyer, MD;  Location: [facility] LD ORS;  Service: Gynecology;  Laterality: N/A;   INDUCED ABORTION     Family History  Problem Relation Age of Onset   Hypertension Mother    Lung cancer Father    Muscular dystrophy Sister    Social History   Socioeconomic History   Marital status: Single    Spouse name: Not on file   Number of children: Not on file   Years of education: Not on file   Highest education level: Not on file  Occupational History   Not on file  Tobacco Use   Smoking status: Former    Types: Cigars    Passive exposure: Never   Smokeless tobacco: Never  Vaping Use   Vaping status: Never Used  Substance and Sexual Activity   Alcohol use: Yes    Alcohol/week: 1.0 standard drink of alcohol    Types: 1 Glasses of wine per week   Drug use: Yes    Types: Marijuana    Comment: MONTHLY   Sexual activity: Yes  Other Topics Concern   Not on file  Social History Narrative   Not on file   Social Drivers of Health   Tobacco Use: Medium Risk (03/23/2024)   Patient History    Smoking Tobacco Use: Former    Smokeless Tobacco Use: Never    Passive Exposure: Never  Programmer, Applications: Not on file  Food Insecurity: Not on file  Transportation Needs: Not on file  Physical Activity: Not on file  Stress: Not on file  Social Connections: Not on file  Intimate Partner Violence: Not on  file  Depression (PHQ2-9): Low Risk (03/23/2024)   Depression (PHQ2-9)    PHQ-2 Score: 0  Alcohol Screen: Not on file  Housing: Not on file  Utilities: Not on file  Health Literacy: Not on file   Outpatient Medications Prior to Visit  Medication Sig Dispense Refill   ketoconazole (NIZORAL) 2 % shampoo Apply 1 Application topically See admin instructions. 1 application 2-3 times a week     terbinafine  (LAMISIL ) 250 MG tablet Take 1 tablet (250 mg total) by mouth daily. 30 tablet 1   acetaminophen  (TYLENOL ) 325 MG tablet Take 2 tablets (650 mg total) by mouth every 6 (six) hours as needed for up to 30 doses for mild pain (pain score 1-3). 30 tablet 0   acetaminophen  (TYLENOL ) 500 MG tablet Take 2 tablets (1,000  mg total) by mouth every 8 (eight) hours as needed. 30 tablet 0   Calcium  Carbonate Antacid (TUMS PO) Take 2 tablets by mouth 2 (two) times daily as needed (acid reflux).     ferrous sulfate  325 (65 FE) MG tablet Take 1 tablet (325 mg total) by mouth daily with breakfast. 30 tablet 1   ibuprofen  (ADVIL ) 600 MG tablet Take 1 tablet (600 mg total) by mouth every 6 (six) hours as needed for up to 30 doses for moderate pain (pain score 4-6). 30 tablet 0   NIFEdipine  (ADALAT  CC) 30 MG 24 hr tablet Take 1 tablet (30 mg total) by mouth daily. 30 tablet 1   Prenatal Vit w/Fe-Methylfol-FA (PNV-SELECT) 27-0.6-0.4 MG TABS Take 1 tablet by mouth daily.     oxyCODONE  (OXY IR/ROXICODONE ) 5 MG immediate release tablet Take 1-2 tablets (5-10 mg total) by mouth every 4 (four) hours as needed for moderate pain. (Patient not taking: Reported on 03/22/2024) 20 tablet 0   oxyCODONE  (ROXICODONE ) 5 MG immediate release tablet Take 1 tablet (5 mg total) by mouth every 8 (eight) hours as needed for up to 10 doses for severe pain (pain score 7-10). (Patient not taking: Reported on 03/22/2024) 10 tablet 0   No facility-administered medications prior to visit.   Allergies[1]  ROS: see HPI     Objective    Today's Vitals   03/23/24 1617  BP: 135/87  Pulse: 96  Resp: 16  SpO2: 98%  Weight: 114 lb (51.7 kg)  Height: 5' 2 (1.575 m)  PainSc: 0-No pain   GENERAL: Well-appearing, in NAD. Well nourished.  SKIN: Pink, warm and dry. No rash, lesion, ulceration, or ecchymoses.  Head: Normocephalic. NECK: Trachea midline. Full ROM w/o pain or tenderness. No lymphadenopathy.  EARS: Tympanic membranes are intact, translucent without bulging and without drainage. Appropriate landmarks visualized.  EYES: Conjunctiva clear without exudates. EOMI, PERRL, no drainage present.  NOSE: Septum midline w/o deformity. Nares patent, mucosa pink and non-inflamed w/o drainage. No sinus tenderness.  THROAT: Uvula midline. Oropharynx clear. Tonsils non-inflamed without exudate. Mucous membranes pink and moist.  RESPIRATORY: Chest wall symmetrical. Respirations even and non-labored. Breath sounds clear to auscultation bilaterally.  CARDIAC: S1, S2 present, regular rate and rhythm without murmur or gallops. Peripheral pulses 2+ bilaterally.  MSK: Muscle tone and strength appropriate for age. Joints w/o tenderness, redness, or swelling.  EXTREMITIES: Without clubbing, cyanosis, or edema.  NEUROLOGIC: No motor or sensory deficits. Steady, even gait. C2-C12 intact.  PSYCH/MENTAL STATUS: Alert, oriented x 3. Cooperative, appropriate mood and affect.     Assessment & Plan:   1. Encounter to establish care (Primary) Patient is a 47- year-old female who presents today to establish care with primary care at [facility]. Reviewed the past medical history, family history, social history, surgical history, medications and allergies today- updates made as indicated. Patient has concerns today about the following:   2. Essential tremor Chronic bilateral hand tremors for 30 years, no progression. Differential includes physiological, metabolic, neurological, and functional causes. Wilson's disease not pursued previously. Ordered  fasting labs: copper levels, thyroid function, liver/kidney function, ESR, CRP, B12, A1c. Will refer to neurology if labs inconclusive.  - Ceruloplasmin - Copper, serum - TSH Rfx on Abnormal to Free T4 - Comprehensive metabolic panel with GFR - Sed Rate (ESR) - C-reactive protein - Vitamin B12 - Hemoglobin A1c  3. Screening for lipid disorders Will complete fasting lipid panel when she returns .  - Lipid panel  Return for Physical.  Evalene Arts, FNP     [1]  Allergies Allergen Reactions   Amoxicillin Other (See Comments)    unknown   Penicillins     unknown   Shrimp (Diagnostic) Itching and Swelling    Only raw shrimp Swelling of eyes   "

## 2024-04-03 ENCOUNTER — Encounter: Payer: Self-pay | Admitting: Family Medicine

## 2024-04-12 ENCOUNTER — Encounter: Payer: Self-pay | Admitting: Family Medicine

## 2024-04-21 ENCOUNTER — Encounter: Payer: Self-pay | Admitting: Family Medicine

## 2024-05-03 ENCOUNTER — Ambulatory Visit: Admitting: Podiatry
